# Patient Record
Sex: Female | Born: 1940 | Race: Black or African American | Hispanic: No | Marital: Single | Smoking: Never smoker
Health system: Southern US, Community
[De-identification: ages and names within clinical notes are randomized; demographics above are authoritative.]

## PROBLEM LIST (undated history)

## (undated) DIAGNOSIS — I10 Essential (primary) hypertension: Secondary | ICD-10-CM

## (undated) DIAGNOSIS — M1990 Unspecified osteoarthritis, unspecified site: Secondary | ICD-10-CM

## (undated) DIAGNOSIS — C189 Malignant neoplasm of colon, unspecified: Secondary | ICD-10-CM

## (undated) DIAGNOSIS — Z5189 Encounter for other specified aftercare: Secondary | ICD-10-CM

## (undated) HISTORY — DX: Encounter for other specified aftercare: Z51.89

## (undated) HISTORY — DX: Unspecified osteoarthritis, unspecified site: M19.90

## (undated) HISTORY — PX: COLONOSCOPY: SHX174

## (undated) HISTORY — DX: Malignant neoplasm of colon, unspecified: C18.9

## (undated) HISTORY — PX: BREAST REDUCTION SURGERY: SHX8

## (undated) HISTORY — DX: Essential (primary) hypertension: I10

## (undated) HISTORY — PX: TOTAL ABDOMINAL HYSTERECTOMY: SHX209

---

## 1984-11-19 HISTORY — PX: EVACUATION BREAST HEMATOMA: SHX1537

## 1987-11-20 HISTORY — PX: REDUCTION MAMMAPLASTY: SUR839

## 1997-11-19 DIAGNOSIS — C189 Malignant neoplasm of colon, unspecified: Secondary | ICD-10-CM

## 1997-11-19 HISTORY — PX: COLON SURGERY: SHX602

## 1997-11-19 HISTORY — DX: Malignant neoplasm of colon, unspecified: C18.9

## 1998-10-11 ENCOUNTER — Inpatient Hospital Stay (HOSPITAL_COMMUNITY): Admission: AD | Admit: 1998-10-11 | Discharge: 1998-10-19 | Payer: Self-pay | Admitting: Gastroenterology

## 1998-10-12 ENCOUNTER — Encounter: Payer: Self-pay | Admitting: Gastroenterology

## 1999-10-30 ENCOUNTER — Other Ambulatory Visit: Admission: RE | Admit: 1999-10-30 | Discharge: 1999-10-30 | Payer: Self-pay | Admitting: Gastroenterology

## 1999-10-30 ENCOUNTER — Encounter (INDEPENDENT_AMBULATORY_CARE_PROVIDER_SITE_OTHER): Payer: Self-pay

## 1999-11-10 ENCOUNTER — Encounter: Admission: RE | Admit: 1999-11-10 | Discharge: 1999-11-10 | Payer: Self-pay | Admitting: General Surgery

## 1999-11-10 ENCOUNTER — Encounter (HOSPITAL_BASED_OUTPATIENT_CLINIC_OR_DEPARTMENT_OTHER): Payer: Self-pay | Admitting: General Surgery

## 2000-11-15 ENCOUNTER — Encounter: Admission: RE | Admit: 2000-11-15 | Discharge: 2000-11-15 | Payer: Self-pay | Admitting: General Surgery

## 2000-11-15 ENCOUNTER — Encounter (HOSPITAL_BASED_OUTPATIENT_CLINIC_OR_DEPARTMENT_OTHER): Payer: Self-pay | Admitting: General Surgery

## 2001-11-17 ENCOUNTER — Encounter: Admission: RE | Admit: 2001-11-17 | Discharge: 2001-11-17 | Payer: Self-pay | Admitting: Internal Medicine

## 2001-11-17 ENCOUNTER — Encounter: Payer: Self-pay | Admitting: Internal Medicine

## 2001-11-21 ENCOUNTER — Encounter: Admission: RE | Admit: 2001-11-21 | Discharge: 2001-11-21 | Payer: Self-pay | Admitting: Internal Medicine

## 2001-11-21 ENCOUNTER — Encounter: Payer: Self-pay | Admitting: Internal Medicine

## 2002-11-26 ENCOUNTER — Encounter: Payer: Self-pay | Admitting: Internal Medicine

## 2002-11-26 ENCOUNTER — Encounter: Admission: RE | Admit: 2002-11-26 | Discharge: 2002-11-26 | Payer: Self-pay | Admitting: Internal Medicine

## 2003-12-30 ENCOUNTER — Encounter: Admission: RE | Admit: 2003-12-30 | Discharge: 2003-12-30 | Payer: Self-pay | Admitting: Internal Medicine

## 2005-01-04 ENCOUNTER — Encounter: Admission: RE | Admit: 2005-01-04 | Discharge: 2005-01-04 | Payer: Self-pay | Admitting: Internal Medicine

## 2006-01-08 ENCOUNTER — Encounter: Admission: RE | Admit: 2006-01-08 | Discharge: 2006-01-08 | Payer: Self-pay | Admitting: Internal Medicine

## 2007-01-14 ENCOUNTER — Encounter: Admission: RE | Admit: 2007-01-14 | Discharge: 2007-01-14 | Payer: Self-pay | Admitting: Internal Medicine

## 2007-10-21 ENCOUNTER — Ambulatory Visit: Payer: Self-pay | Admitting: Gastroenterology

## 2007-11-04 ENCOUNTER — Ambulatory Visit: Payer: Self-pay | Admitting: Gastroenterology

## 2008-02-13 ENCOUNTER — Encounter: Admission: RE | Admit: 2008-02-13 | Discharge: 2008-02-13 | Payer: Self-pay | Admitting: Internal Medicine

## 2009-02-14 ENCOUNTER — Encounter: Admission: RE | Admit: 2009-02-14 | Discharge: 2009-02-14 | Payer: Self-pay | Admitting: Internal Medicine

## 2010-02-15 ENCOUNTER — Encounter: Admission: RE | Admit: 2010-02-15 | Discharge: 2010-02-15 | Payer: Self-pay | Admitting: Internal Medicine

## 2011-01-26 ENCOUNTER — Other Ambulatory Visit: Payer: Self-pay | Admitting: Internal Medicine

## 2011-01-26 DIAGNOSIS — Z1231 Encounter for screening mammogram for malignant neoplasm of breast: Secondary | ICD-10-CM

## 2011-02-19 ENCOUNTER — Ambulatory Visit
Admission: RE | Admit: 2011-02-19 | Discharge: 2011-02-19 | Disposition: A | Discharge status: Home / Self Care | Payer: Medicare Other | Source: Ambulatory Visit | Attending: Internal Medicine | Admitting: Internal Medicine

## 2011-02-19 DIAGNOSIS — Z1231 Encounter for screening mammogram for malignant neoplasm of breast: Secondary | ICD-10-CM

## 2012-01-29 ENCOUNTER — Other Ambulatory Visit: Payer: Self-pay | Admitting: Internal Medicine

## 2012-01-29 DIAGNOSIS — Z1231 Encounter for screening mammogram for malignant neoplasm of breast: Secondary | ICD-10-CM

## 2012-02-26 ENCOUNTER — Ambulatory Visit
Admission: RE | Admit: 2012-02-26 | Discharge: 2012-02-26 | Disposition: A | Discharge status: Home / Self Care | Payer: BC Managed Care – PPO | Source: Ambulatory Visit | Attending: Internal Medicine | Admitting: Internal Medicine

## 2012-02-26 DIAGNOSIS — Z1231 Encounter for screening mammogram for malignant neoplasm of breast: Secondary | ICD-10-CM

## 2012-10-10 ENCOUNTER — Encounter: Payer: Self-pay | Admitting: Gastroenterology

## 2013-02-05 ENCOUNTER — Other Ambulatory Visit: Payer: Self-pay

## 2013-02-27 ENCOUNTER — Ambulatory Visit
Admission: RE | Admit: 2013-02-27 | Discharge: 2013-02-27 | Disposition: A | Discharge status: Home / Self Care | Payer: Medicare Other | Source: Ambulatory Visit

## 2013-02-27 DIAGNOSIS — Z1231 Encounter for screening mammogram for malignant neoplasm of breast: Secondary | ICD-10-CM

## 2014-02-11 ENCOUNTER — Other Ambulatory Visit: Payer: Self-pay

## 2014-02-11 DIAGNOSIS — Z1231 Encounter for screening mammogram for malignant neoplasm of breast: Secondary | ICD-10-CM

## 2014-02-11 DIAGNOSIS — Z9889 Other specified postprocedural states: Secondary | ICD-10-CM

## 2014-03-01 ENCOUNTER — Ambulatory Visit
Admission: RE | Admit: 2014-03-01 | Discharge: 2014-03-01 | Disposition: A | Discharge status: Home / Self Care | Payer: Medicare Other | Source: Ambulatory Visit

## 2014-03-01 DIAGNOSIS — Z1231 Encounter for screening mammogram for malignant neoplasm of breast: Secondary | ICD-10-CM

## 2014-03-01 DIAGNOSIS — Z9889 Other specified postprocedural states: Secondary | ICD-10-CM

## 2014-08-23 ENCOUNTER — Encounter: Payer: Self-pay | Admitting: Gastroenterology

## 2014-08-26 ENCOUNTER — Encounter: Payer: Self-pay | Admitting: Gastroenterology

## 2014-10-20 ENCOUNTER — Ambulatory Visit (AMBULATORY_SURGERY_CENTER): Payer: Self-pay | Admitting: *Deleted

## 2014-10-20 VITALS — Ht 61.0 in | Wt 247.0 lb

## 2014-10-20 DIAGNOSIS — Z85038 Personal history of other malignant neoplasm of large intestine: Secondary | ICD-10-CM

## 2014-10-20 MED ORDER — NA SULFATE-K SULFATE-MG SULF 17.5-3.13-1.6 GM/177ML PO SOLN: ORAL | Status: DC

## 2014-10-20 NOTE — Progress Notes (Signed)
Patient denies any allergies to eggs or soy. Patient denies any problems with anesthesia/sedation. Patient denies any oxygen use at home and does not take any diet/weight loss medications. EMMI education assisgned to patient on colonoscopy, this was explained and instructions given to patient. 

## 2014-11-03 ENCOUNTER — Encounter: Payer: Self-pay | Admitting: Gastroenterology

## 2014-11-03 ENCOUNTER — Ambulatory Visit (AMBULATORY_SURGERY_CENTER): Payer: Medicare Other | Admitting: Gastroenterology

## 2014-11-03 VITALS — BP 131/68 | HR 87 | Temp 97.4°F | Resp 23 | Ht 61.0 in | Wt 247.0 lb

## 2014-11-03 DIAGNOSIS — Z85038 Personal history of other malignant neoplasm of large intestine: Secondary | ICD-10-CM

## 2014-11-03 DIAGNOSIS — D123 Benign neoplasm of transverse colon: Secondary | ICD-10-CM

## 2014-11-03 DIAGNOSIS — K5730 Diverticulosis of large intestine without perforation or abscess without bleeding: Secondary | ICD-10-CM

## 2014-11-03 MED ORDER — SODIUM CHLORIDE 0.9 % IV SOLN: 500.0000 mL | INTRAVENOUS | Status: DC

## 2014-11-03 NOTE — Op Note (Signed)
[facility]  [address] [place], [address]   COLONOSCOPY PROCEDURE REPORT  PATIENT: Adrienne, Sanchez  MR#: [id number] BIRTHDATE: Apr 11, 1941 , 4  yrs. old GENDER: female ENDOSCOPIST: Inda Castle, MD REFERRED ES:PQZR Laurann Montana, M.D. PROCEDURE DATE:  11/03/2014 PROCEDURE:   Colonoscopy with snare polypectomy First Screening Colonoscopy - Avg.  risk and is 50 yrs.  old or older - No.  Prior Negative Screening - Now for repeat screening. N/A  History of Adenoma - Now for follow-up colonoscopy & has been > or = to 3 yrs.  N/A  Polyps Removed Today? Yes. ASA CLASS:   Class II INDICATIONS:high risk personal history of colon cancer. 1999 MEDICATIONS: Monitored anesthesia care and Propofol 180 mg IV  DESCRIPTION OF PROCEDURE:   After the risks benefits and alternatives of the procedure were thoroughly explained, informed consent was obtained.  The digital rectal exam revealed no abnormalities of the rectum.   The LB [id number]  endoscope was introduced through the anus and advanced to the surgical anastomosis. No adverse events experienced.   The quality of the prep was excellent using Suprep  The instrument was then slowly withdrawn as the colon was fully examined.      COLON FINDINGS: A sessile polyp measuring 9 mm in size was found in the proximal transverse colon.  A polypectomy was performed with a cold snare.  The resection was complete, the polyp tissue was completely retrieved and sent to histology.   There was mild diverticulosis noted in the descending colon and sigmoid colon. Retroflexed views revealed no abnormalities. The time to cecum=2 minutes 24 seconds.  Withdrawal time=6 minutes 34 seconds.  The scope was withdrawn and the procedure completed. COMPLICATIONS: There were no immediate complications.  ENDOSCOPIC IMPRESSION: 1.   Sessile polyp was found in the proximal transverse colon; polypectomy was performed with a cold snare 2.    Mild diverticulosis was noted in the descending colon and sigmoid colon  RECOMMENDATIONS: Colonoscopy 5 years  eSigned:  Inda Castle, MD 11/03/2014 12:10 PM   cc:

## 2014-11-03 NOTE — Progress Notes (Signed)
Called to room to assist during endoscopic procedure.  Patient ID and intended procedure confirmed with present staff. Received instructions for my participation in the procedure from the performing physician.  

## 2014-11-03 NOTE — Progress Notes (Signed)
A/ox3 pleased with MAC, report to Sheila RN 

## 2014-11-03 NOTE — Patient Instructions (Signed)
Discharge instructions given. Handouts on polyps and diverticulosis. Resume previous medications. YOU HAD AN ENDOSCOPIC PROCEDURE TODAY AT THE [facility]: Refer to the procedure report that was given to you for any specific questions about what was found during the examination.  If the procedure report does not answer your questions, please call your gastroenterologist to clarify.  If you requested that your care partner not be given the details of your procedure findings, then the procedure report has been included in a sealed envelope for you to review at your convenience later.  YOU SHOULD EXPECT: Some feelings of bloating in the abdomen. Passage of more gas than usual.  Walking can help get rid of the air that was put into your GI tract during the procedure and reduce the bloating. If you had a lower endoscopy (such as a colonoscopy or flexible sigmoidoscopy) you may notice spotting of blood in your stool or on the toilet paper. If you underwent a bowel prep for your procedure, then you may not have a normal bowel movement for a few days.  DIET: Your first meal following the procedure should be a light meal and then it is ok to progress to your normal diet.  A half-sandwich or bowl of soup is an example of a good first meal.  Heavy or fried foods are harder to digest and may make you feel nauseous or bloated.  Likewise meals heavy in dairy and vegetables can cause extra gas to form and this can also increase the bloating.  Drink plenty of fluids but you should avoid alcoholic beverages for 24 hours.  ACTIVITY: Your care partner should take you home directly after the procedure.  You should plan to take it easy, moving slowly for the rest of the day.  You can resume normal activity the day after the procedure however you should NOT DRIVE or use heavy machinery for 24 hours (because of the sedation medicines used during the test).    SYMPTOMS TO REPORT IMMEDIATELY: A gastroenterologist  can be reached at any hour.  During normal business hours, 8:30 AM to 5:00 PM Monday through Friday, call [phone].  After hours and on weekends, please call the GI answering service at [phone] who will take a message and have the physician on call contact you.   Following lower endoscopy (colonoscopy or flexible sigmoidoscopy):  Excessive amounts of blood in the stool  Significant tenderness or worsening of abdominal pains  Swelling of the abdomen that is new, acute  Fever of 100F or higher  FOLLOW UP: If any biopsies were taken you will be contacted by phone or by letter within the next 1-3 weeks.  Call your gastroenterologist if you have not heard about the biopsies in 3 weeks.  Our staff will call the home number listed on your records the next business day following your procedure to check on you and address any questions or concerns that you may have at that time regarding the information given to you following your procedure. This is a courtesy call and so if there is no answer at the home number and we have not heard from you through the emergency physician on call, we will assume that you have returned to your regular daily activities without incident.  SIGNATURES/CONFIDENTIALITY: You and/or your care partner have signed paperwork which will be entered into your electronic medical record.  These signatures attest to the fact that that the information above on your After Visit Summary has been reviewed   and is understood.  Full responsibility of the confidentiality of this discharge information lies with you and/or your care-partner. 

## 2014-11-04 ENCOUNTER — Telehealth: Payer: Self-pay | Admitting: *Deleted

## 2014-11-04 NOTE — Telephone Encounter (Signed)
  Follow up Call-  Call back number 11/03/2014  Post procedure Call Back phone  # [phone]  Permission to leave phone message Yes     Patient questions:  Do you have a fever, pain , or abdominal swelling? No. Pain Score  0 *  Have you tolerated food without any problems? Yes.    Have you been able to return to your normal activities? Yes.    Do you have any questions about your discharge instructions: Diet   No. Medications  No. Follow up visit  No.  Do you have questions or concerns about your Care? No.  Actions: * If pain score is 4 or above: No action needed, pain <4.

## 2014-11-09 ENCOUNTER — Encounter: Payer: Self-pay | Admitting: Gastroenterology

## 2014-12-27 ENCOUNTER — Emergency Department: Payer: Self-pay | Admitting: Student

## 2014-12-27 LAB — CBC WITH DIFFERENTIAL/PLATELET
BASOS PCT: 0.4 %
Basophil #: 0 10*3/uL (ref 0.0–0.1)
EOS PCT: 1.3 %
Eosinophil #: 0.1 10*3/uL (ref 0.0–0.7)
HCT: 43.9 % (ref 35.0–47.0)
HGB: 14.5 g/dL (ref 12.0–16.0)
Lymphocyte #: 2.1 10*3/uL (ref 1.0–3.6)
Lymphocyte %: 21.4 %
MCH: 29.9 pg (ref 26.0–34.0)
MCHC: 33 g/dL (ref 32.0–36.0)
MCV: 91 fL (ref 80–100)
Monocyte #: 0.8 x10 3/mm (ref 0.2–0.9)
Monocyte %: 8.1 %
NEUTROS PCT: 68.8 %
Neutrophil #: 6.7 10*3/uL — ABNORMAL HIGH (ref 1.4–6.5)
Platelet: 421 10*3/uL (ref 150–440)
RBC: 4.84 10*6/uL (ref 3.80–5.20)
RDW: 14.3 % (ref 11.5–14.5)
WBC: 9.7 10*3/uL (ref 3.6–11.0)

## 2014-12-27 LAB — COMPREHENSIVE METABOLIC PANEL
ALK PHOS: 75 U/L (ref 46–116)
Albumin: 3.3 g/dL — ABNORMAL LOW (ref 3.4–5.0)
Anion Gap: 10 (ref 7–16)
BILIRUBIN TOTAL: 0.5 mg/dL (ref 0.2–1.0)
BUN: 13 mg/dL (ref 7–18)
CREATININE: 0.86 mg/dL (ref 0.60–1.30)
Calcium, Total: 8.8 mg/dL (ref 8.5–10.1)
Chloride: 105 mmol/L (ref 98–107)
Co2: 24 mmol/L (ref 21–32)
Glucose: 151 mg/dL — ABNORMAL HIGH (ref 65–99)
OSMOLALITY: 281 (ref 275–301)
Potassium: 3.7 mmol/L (ref 3.5–5.1)
SGOT(AST): 28 U/L (ref 15–37)
SGPT (ALT): 26 U/L (ref 14–63)
Sodium: 139 mmol/L (ref 136–145)
Total Protein: 7.3 g/dL (ref 6.4–8.2)

## 2014-12-27 LAB — URINALYSIS, COMPLETE
Bilirubin,UR: NEGATIVE
Glucose,UR: NEGATIVE mg/dL (ref 0–75)
KETONE: NEGATIVE
Leukocyte Esterase: NEGATIVE
Nitrite: NEGATIVE
PROTEIN: NEGATIVE
Ph: 5 (ref 4.5–8.0)
RBC,UR: 3 /HPF (ref 0–5)
SPECIFIC GRAVITY: 1.021 (ref 1.003–1.030)
Squamous Epithelial: <1
WBC UR: 2 /HPF (ref 0–5)

## 2015-02-16 ENCOUNTER — Other Ambulatory Visit: Payer: Self-pay

## 2015-02-16 DIAGNOSIS — Z1231 Encounter for screening mammogram for malignant neoplasm of breast: Secondary | ICD-10-CM

## 2015-03-08 ENCOUNTER — Ambulatory Visit
Admission: RE | Admit: 2015-03-08 | Discharge: 2015-03-08 | Disposition: A | Discharge status: Home / Self Care | Payer: Self-pay | Source: Ambulatory Visit

## 2015-03-08 DIAGNOSIS — Z1231 Encounter for screening mammogram for malignant neoplasm of breast: Secondary | ICD-10-CM

## 2015-12-16 ENCOUNTER — Ambulatory Visit
Admission: RE | Admit: 2015-12-16 | Discharge: 2015-12-16 | Disposition: A | Discharge status: Home / Self Care | Payer: 59 | Source: Ambulatory Visit | Attending: Internal Medicine | Admitting: Internal Medicine

## 2015-12-16 ENCOUNTER — Other Ambulatory Visit: Payer: Self-pay | Admitting: Internal Medicine

## 2015-12-16 DIAGNOSIS — M25561 Pain in right knee: Secondary | ICD-10-CM

## 2016-03-13 ENCOUNTER — Other Ambulatory Visit: Payer: Self-pay

## 2016-03-13 DIAGNOSIS — Z1231 Encounter for screening mammogram for malignant neoplasm of breast: Secondary | ICD-10-CM

## 2016-03-29 ENCOUNTER — Ambulatory Visit
Admission: RE | Admit: 2016-03-29 | Discharge: 2016-03-29 | Disposition: A | Discharge status: Home / Self Care | Payer: Medicare Other | Source: Ambulatory Visit

## 2016-03-29 DIAGNOSIS — Z1231 Encounter for screening mammogram for malignant neoplasm of breast: Secondary | ICD-10-CM

## 2017-03-08 ENCOUNTER — Other Ambulatory Visit: Payer: Self-pay | Admitting: Internal Medicine

## 2017-03-08 DIAGNOSIS — Z1231 Encounter for screening mammogram for malignant neoplasm of breast: Secondary | ICD-10-CM

## 2017-04-02 ENCOUNTER — Ambulatory Visit
Admission: RE | Admit: 2017-04-02 | Discharge: 2017-04-02 | Disposition: A | Discharge status: Home / Self Care | Payer: Medicare Other | Source: Ambulatory Visit | Attending: Internal Medicine | Admitting: Internal Medicine

## 2017-04-02 DIAGNOSIS — Z1231 Encounter for screening mammogram for malignant neoplasm of breast: Secondary | ICD-10-CM

## 2018-03-20 ENCOUNTER — Other Ambulatory Visit: Payer: Self-pay | Admitting: Internal Medicine

## 2018-03-20 DIAGNOSIS — Z1231 Encounter for screening mammogram for malignant neoplasm of breast: Secondary | ICD-10-CM

## 2018-04-10 ENCOUNTER — Ambulatory Visit
Admission: RE | Admit: 2018-04-10 | Discharge: 2018-04-10 | Disposition: A | Discharge status: Home / Self Care | Payer: Medicare Other | Source: Ambulatory Visit | Attending: Internal Medicine | Admitting: Internal Medicine

## 2018-04-10 DIAGNOSIS — Z1231 Encounter for screening mammogram for malignant neoplasm of breast: Secondary | ICD-10-CM

## 2018-07-30 ENCOUNTER — Other Ambulatory Visit: Payer: Self-pay | Admitting: Internal Medicine

## 2018-07-30 ENCOUNTER — Ambulatory Visit
Admission: RE | Admit: 2018-07-30 | Discharge: 2018-07-30 | Disposition: A | Discharge status: Home / Self Care | Payer: Medicare Other | Source: Ambulatory Visit | Attending: Internal Medicine | Admitting: Internal Medicine

## 2018-07-30 DIAGNOSIS — R0601 Orthopnea: Secondary | ICD-10-CM

## 2018-08-29 ENCOUNTER — Emergency Department (HOSPITAL_COMMUNITY): Payer: Medicare Other

## 2018-08-29 ENCOUNTER — Encounter (HOSPITAL_COMMUNITY): Payer: Self-pay | Admitting: *Deleted

## 2018-08-29 ENCOUNTER — Emergency Department (HOSPITAL_COMMUNITY)
Admission: EM | Admit: 2018-08-29 | Discharge: 2018-08-29 | Disposition: A | Discharge status: Home / Self Care | Payer: Medicare Other | Attending: Emergency Medicine | Admitting: Emergency Medicine

## 2018-08-29 DIAGNOSIS — I10 Essential (primary) hypertension: Secondary | ICD-10-CM | POA: Insufficient documentation

## 2018-08-29 DIAGNOSIS — R0600 Dyspnea, unspecified: Secondary | ICD-10-CM | POA: Diagnosis present

## 2018-08-29 DIAGNOSIS — E876 Hypokalemia: Secondary | ICD-10-CM | POA: Diagnosis not present

## 2018-08-29 DIAGNOSIS — Z79899 Other long term (current) drug therapy: Secondary | ICD-10-CM | POA: Diagnosis not present

## 2018-08-29 LAB — CBC
HEMATOCRIT: 48.3 % — AB (ref 36.0–46.0)
Hemoglobin: 15 g/dL (ref 12.0–15.0)
MCH: 28.7 pg (ref 26.0–34.0)
MCHC: 31.1 g/dL (ref 30.0–36.0)
MCV: 92.4 fL (ref 80.0–100.0)
NRBC: 0 % (ref 0.0–0.2)
Platelets: 482 10*3/uL — ABNORMAL HIGH (ref 150–400)
RBC: 5.23 MIL/uL — ABNORMAL HIGH (ref 3.87–5.11)
RDW: 13.9 % (ref 11.5–15.5)
WBC: 9.4 10*3/uL (ref 4.0–10.5)

## 2018-08-29 LAB — COMPREHENSIVE METABOLIC PANEL
ALT: 23 U/L (ref 0–44)
AST: 34 U/L (ref 15–41)
Albumin: 3.8 g/dL (ref 3.5–5.0)
Alkaline Phosphatase: 60 U/L (ref 38–126)
Anion gap: 14 (ref 5–15)
BUN: 7 mg/dL — AB (ref 8–23)
CHLORIDE: 97 mmol/L — AB (ref 98–111)
CO2: 26 mmol/L (ref 22–32)
CREATININE: 1.06 mg/dL — AB (ref 0.44–1.00)
Calcium: 9.4 mg/dL (ref 8.9–10.3)
GFR calc Af Amer: 56 mL/min — ABNORMAL LOW (ref >60)
GFR calc non Af Amer: 49 mL/min — ABNORMAL LOW (ref >60)
Glucose, Bld: 171 mg/dL — ABNORMAL HIGH (ref 70–99)
Potassium: 3 mmol/L — ABNORMAL LOW (ref 3.5–5.1)
SODIUM: 137 mmol/L (ref 135–145)
Total Bilirubin: 0.9 mg/dL (ref 0.3–1.2)
Total Protein: 7.2 g/dL (ref 6.5–8.1)

## 2018-08-29 LAB — TROPONIN I: Troponin I: <0.03 ng/mL (ref <0.03)

## 2018-08-29 LAB — URINALYSIS, ROUTINE W REFLEX MICROSCOPIC
Bilirubin Urine: NEGATIVE
GLUCOSE, UA: NEGATIVE mg/dL
Hgb urine dipstick: NEGATIVE
Ketones, ur: 20 mg/dL — AB
LEUKOCYTES UA: NEGATIVE
Nitrite: NEGATIVE
PH: 8 (ref 5.0–8.0)
Protein, ur: NEGATIVE mg/dL
SPECIFIC GRAVITY, URINE: 1.005 (ref 1.005–1.030)

## 2018-08-29 LAB — LIPASE, BLOOD: Lipase: 29 U/L (ref 11–51)

## 2018-08-29 MED ORDER — POTASSIUM CHLORIDE CRYS ER 20 MEQ PO TBCR
40.0000 meq | EXTENDED_RELEASE_TABLET | Freq: Once | ORAL | Status: AC
  Administered 2018-08-29: 40 meq via ORAL
  Filled 2018-08-29: qty 2

## 2018-08-29 MED ORDER — FUROSEMIDE 20 MG PO TABS: 20.0000 mg | ORAL_TABLET | Freq: Every day | ORAL | 0 refills | Status: AC

## 2018-08-29 MED ORDER — FUROSEMIDE 10 MG/ML IJ SOLN
40.0000 mg | Freq: Once | INTRAMUSCULAR | Status: AC
  Administered 2018-08-29: 40 mg via INTRAVENOUS
  Filled 2018-08-29: qty 4

## 2018-08-29 MED ORDER — POTASSIUM CHLORIDE CRYS ER 20 MEQ PO TBCR: 20.0000 meq | EXTENDED_RELEASE_TABLET | Freq: Every day | ORAL | 0 refills | Status: DC

## 2018-08-29 NOTE — ED Provider Notes (Signed)
[facility] EMERGENCY DEPARTMENT Provider Note   CSN: [id number] Arrival date & time: 08/29/18  6283     History   Chief Complaint Chief Complaint  Patient presents with  . Emesis    HPI Adrienne Sanchez is a 77 y.o. female.  Patient reports dyspnea especially at night.  She has to sleep sitting up and her sleep has been poor.  No substernal chest pain, significant peripheral edema, fever, sweats, chills, productive cough.  No previous diagnosis of congestive heart failure.  Severity of symptoms is moderate.  Lying supine makes symptoms worse.     Past Medical History:  Diagnosis Date  . Arthritis   . Blood transfusion without reported diagnosis   . Colon cancer ([place]) 1999  . Hypertension     There are no active problems to display for this patient.   Past Surgical History:  Procedure Laterality Date  . BREAST REDUCTION SURGERY    . COLON SURGERY  1999   right hemicolectomy, colon cancer  . COLONOSCOPY    . EVACUATION BREAST HEMATOMA Left 1986  . REDUCTION MAMMAPLASTY Bilateral 1989  . TOTAL ABDOMINAL HYSTERECTOMY       OB History   None      Home Medications    Prior to Admission medications   Medication Sig Start Date End Date Taking? Authorizing Provider  metoprolol tartrate (LOPRESSOR) 25 MG tablet Take 25 mg by mouth 2 (two) times daily. 07/30/18  Yes [provider]  valsartan-hydrochlorothiazide (DIOVAN-HCT) 320-25 MG tablet Take 1 tablet by mouth daily. 06/14/18  Yes [provider]  furosemide (LASIX) 20 MG tablet Take 1 tablet (20 mg total) by mouth daily. 08/29/18   Nat Christen, MD  potassium chloride SA (K-DUR,KLOR-CON) 20 MEQ tablet Take 1 tablet (20 mEq total) by mouth daily. 08/29/18   Nat Christen, MD    Family History Family History  Problem Relation Age of Onset  . Heart disease Mother   . Diabetes Mother   . Breast cancer Mother   . Colon cancer Neg Hx     Social History Social History    Tobacco Use  . Smoking status: Never Smoker  . Smokeless tobacco: Never Used  Substance Use Topics  . Alcohol use: No    Alcohol/week: 0.0 standard drinks  . Drug use: No     Allergies   Patient has no known allergies.   Review of Systems Review of Systems  All other systems reviewed and are negative.    Physical Exam Updated Vital Signs BP (!) 145/74   Pulse 93   Temp 98.3 F (36.8 C) (Oral)   Resp 13   Ht 5\' 2"  (1.575 m)   Wt 112 kg   SpO2 100%   BMI 45.18 kg/m   Physical Exam  Constitutional: She is oriented to person, place, and time. She appears well-developed and well-nourished.  Overweight, no acute distress  HENT:  Head: Normocephalic and atraumatic.  Eyes: Conjunctivae are normal.  Neck: Neck supple.  Cardiovascular: Normal rate and regular rhythm.  Pulmonary/Chest: Effort normal and breath sounds normal.  Abdominal: Soft. Bowel sounds are normal.  Musculoskeletal: Normal range of motion.  Neurological: She is alert and oriented to person, place, and time.  Skin: Skin is warm and dry.  No significant peripheral edema.    Psychiatric: She has a normal mood and affect. Her behavior is normal.  Nursing note and vitals reviewed.    ED Treatments / Results  Labs (all labs ordered  are listed, but only abnormal results are displayed) Labs Reviewed  COMPREHENSIVE METABOLIC PANEL - Abnormal; Notable for the following components:      Result Value   Potassium 3.0 (*)    Chloride 97 (*)    Glucose, Bld 171 (*)    BUN 7 (*)    Creatinine, Ser 1.06 (*)    GFR calc non Af Amer 49 (*)    GFR calc Af Amer 56 (*)    All other components within normal limits  CBC - Abnormal; Notable for the following components:   RBC 5.23 (*)    HCT 48.3 (*)    Platelets 482 (*)    All other components within normal limits  URINALYSIS, ROUTINE W REFLEX MICROSCOPIC - Abnormal; Notable for the following components:   Color, Urine STRAW (*)    Ketones, ur 20 (*)     All other components within normal limits  LIPASE, BLOOD  TROPONIN I    EKG EKG Interpretation  Date/Time:  Friday August 29 2018 13:33:30 EDT Ventricular Rate:  87 PR Interval:    QRS Duration: 145 QT Interval:  428 QTC Calculation: 515 R Axis:   -82 Text Interpretation:  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 EXAM: CHEST - 2 VIEW COMPARISON:  Prior radiograph from 07/30/2018. FINDINGS: Mild accentuation of the cardiac silhouette related AP technique and shallow lung inflation. Heart size stable, and within normal limits. Mediastinal silhouette normal. Aortic atherosclerosis. Lungs hypoinflated with elevation of the right hemidiaphragm. Perihilar vascular congestion without overt pulmonary edema. No appreciable pleural effusion. No focal infiltrates. Mild left perihilar and basilar atelectasis. No pneumothorax. No acute osseous abnormality. IMPRESSION: 1. Shallow lung inflation with mild perihilar vascular congestion without overt pulmonary edema. 2. Superimposed mild left perihilar and basilar atelectasis. 3. Aortic atherosclerosis. Electronically Signed   By: Jeannine Boga M.D.   On: 08/29/2018 13:26    Procedures Procedures (including critical care time)  Medications Ordered in ED Medications  potassium chloride SA (K-DUR,KLOR-CON) CR tablet 40 mEq (40 mEq Oral Given 08/29/18 1446)  furosemide (LASIX) injection 40 mg (40 mg Intravenous Given 08/29/18 1443)     Initial Impression / Assessment and Plan / ED Course  I have reviewed the triage vital signs and the nursing notes.  Pertinent labs & imaging results that were available during my care of the patient were reviewed by me and considered in my medical decision making (see chart for  details).     Patient presents with persistent dyspnea especially at night when lying down.  Chest x-ray shows no frank pulmonary edema, but "vascular congestion".  Will start low-dose Lasix for 10 days.  Replace potassium.  Follow-up with primary care and cardiology.  Discussed with patient and her close friend.  Final Clinical Impressions(s) / ED Diagnoses   Final diagnoses:  Nocturnal dyspnea  Hypokalemia    ED Discharge Orders         Ordered    furosemide (LASIX) 20 MG tablet  Daily     08/29/18 1506    potassium chloride SA (K-DUR,KLOR-CON) 20 MEQ tablet  Daily     08/29/18 1506           Nat Christen, MD 08/29/18 1525

## 2018-08-29 NOTE — ED Notes (Signed)
ED Provider at bedside. 

## 2018-08-29 NOTE — Discharge Instructions (Addendum)
Prescription for fluid medicine or diuretic called Lasix (furosemide).  Also prescription for potassium.  Follow-up with your primary care doctor.  Recommend a cardiology consultation to further evaluate your heart.

## 2018-08-29 NOTE — ED Notes (Signed)
Pt ambulatory to the restroom with EMT

## 2018-08-29 NOTE — ED Notes (Signed)
Patient transported to X-ray 

## 2018-08-29 NOTE — ED Notes (Signed)
Pt given cranberry juice, snacks provided by family members.

## 2018-08-29 NOTE — ED Triage Notes (Signed)
Pt in c/o vomiting that started this morning, states she woke up during the night and symptoms started then, pt states she was seen about a month ago for shortness of breath and that has not fully resolved either, pt wants to have both evaluated again, no distress noted

## 2018-09-15 ENCOUNTER — Other Ambulatory Visit: Payer: Self-pay | Admitting: Internal Medicine

## 2018-09-15 ENCOUNTER — Other Ambulatory Visit (HOSPITAL_COMMUNITY): Payer: Self-pay | Admitting: Internal Medicine

## 2018-09-15 DIAGNOSIS — R0609 Other forms of dyspnea: Secondary | ICD-10-CM

## 2018-09-15 DIAGNOSIS — R634 Abnormal weight loss: Secondary | ICD-10-CM

## 2018-09-15 DIAGNOSIS — R109 Unspecified abdominal pain: Secondary | ICD-10-CM

## 2018-09-16 ENCOUNTER — Ambulatory Visit
Admission: RE | Admit: 2018-09-16 | Discharge: 2018-09-16 | Disposition: A | Discharge status: Home / Self Care | Payer: Medicare Other | Source: Ambulatory Visit | Attending: Internal Medicine | Admitting: Internal Medicine

## 2018-09-16 DIAGNOSIS — R109 Unspecified abdominal pain: Secondary | ICD-10-CM

## 2018-09-16 DIAGNOSIS — R634 Abnormal weight loss: Secondary | ICD-10-CM

## 2018-09-16 MED ORDER — IOPAMIDOL (ISOVUE-300) INJECTION 61%
125.0000 mL | Freq: Once | INTRAVENOUS | Status: AC | PRN
  Administered 2018-09-16: 125 mL via INTRAVENOUS

## 2018-09-18 ENCOUNTER — Other Ambulatory Visit: Payer: Self-pay

## 2018-09-18 ENCOUNTER — Ambulatory Visit (HOSPITAL_COMMUNITY): Payer: Medicare Other | Attending: Cardiology

## 2018-09-18 DIAGNOSIS — R0609 Other forms of dyspnea: Secondary | ICD-10-CM

## 2018-09-18 MED ORDER — PERFLUTREN LIPID MICROSPHERE
1.0000 mL | INTRAVENOUS | Status: AC | PRN
  Administered 2018-09-18: 2 mL via INTRAVENOUS

## 2019-04-07 ENCOUNTER — Other Ambulatory Visit: Payer: Medicare Other

## 2019-04-07 ENCOUNTER — Other Ambulatory Visit: Payer: Self-pay

## 2019-10-21 ENCOUNTER — Encounter: Payer: Self-pay | Admitting: Gastroenterology

## 2019-10-28 ENCOUNTER — Telehealth: Payer: Self-pay | Admitting: Gastroenterology

## 2019-10-28 NOTE — Telephone Encounter (Signed)
Pt aware. Scheduled pt for an OV to come in and discuss if she needs to proceed with recall colon. Pt scheduled to see Dr. Rush Landmark 12/02/19 at 11:30am. Pt aware of appt.

## 2019-10-28 NOTE — Telephone Encounter (Signed)
She has a history of colon cancer (remote), if this is her recall time period, I am OK to proceed with procedure. If she would like to talk/meet me before and finalize a plan, I am OK to do that as well. Please offer her either option. Thank you. GM

## 2019-10-28 NOTE — Telephone Encounter (Signed)
Pt received recall letter for colon. She states that she is 78 years old and is feeling fine. However, she would prefer to have procedure if Dr. Rush Landmark thinks that she should. Please call her.

## 2019-10-28 NOTE — Telephone Encounter (Signed)
Dr. Rush Landmark please see note below and advise.

## 2019-12-02 ENCOUNTER — Encounter: Payer: Self-pay | Admitting: Gastroenterology

## 2019-12-02 ENCOUNTER — Ambulatory Visit: Payer: Medicare PPO | Admitting: Gastroenterology

## 2019-12-02 VITALS — BP 124/88 | HR 120 | Temp 98.2°F | Ht 61.0 in | Wt 231.4 lb

## 2019-12-02 DIAGNOSIS — Z01818 Encounter for other preprocedural examination: Secondary | ICD-10-CM | POA: Diagnosis not present

## 2019-12-02 DIAGNOSIS — Z86010 Personal history of colonic polyps: Secondary | ICD-10-CM

## 2019-12-02 DIAGNOSIS — Z85038 Personal history of other malignant neoplasm of large intestine: Secondary | ICD-10-CM

## 2019-12-02 NOTE — Progress Notes (Signed)
[place] VISIT   Primary Care Provider Lavone Orn, MD [place] [address] [place] [address] [phone]  Referring Provider Lavone Orn, MD [place] [address],  [place] [address] [phone]  Patient Profile: Adrienne Sanchez is a 79 y.o. female with a pmh significant for colon cancer status post resection via right hemicolectomy, colon polyps, hypertension, arthritis.  The patient presents to the [facility] for an evaluation and management of problem(s) noted below:  Problem List 1. History of colon cancer   2. History of colonic polyps   3. Encounter for pre-operative examination     History of Present Illness This is the patient's first visit to the outpatient [facility] in years.  She was previously followed by Dr. Deatra Ina.  Last colonoscopy was performed in 03/08/2014 and at that time she was found to have a tubular adenoma as well as a well-appearing colonic anastomosis and diverticulosis.  Due to her history of colon cancer status post resection years prior she was recommended a 5-year follow-up.  03/09/2019 has passed and now she is 6 years overdue.  Patient is in relatively good health overall and just takes medications for blood pressure which is well controlled per her report.  She is interested in at least 1 more colonoscopy if we feel she is safe for it in an effort of trying to minimize her risk of recurrent cancer.  She has no other significant GI symptoms or complaints at this time.  She has had a prior hysterectomy.  Patient denies any significant heartburn or pyrosis or dysphagia or odynophagia.  He has never had an upper endoscopy.  GI Review of Systems Positive as above Negative for nausea, vomiting, pain, change in bowel habits, melena, hematochezia  Review of Systems General: Denies fevers/chills/weight loss HEENT: Denies oral lesions Cardiovascular: Denies chest  pain/palpitations Pulmonary: Denies shortness of breath/cough Gastroenterological: See HPI Genitourinary: Denies darkened urine or hematuria Hematological: Denies easy bruising/bleeding  Endocrine: Denies temperature intolerance Dermatological: Denies jaundice Psychological: Mood is stable   Medications Current Outpatient Medications  Medication Sig Dispense Refill  . furosemide (LASIX) 20 MG tablet Take 1 tablet (20 mg total) by mouth daily. (Patient taking differently: Take 20 mg by mouth every other day. ) 10 tablet 0  . potassium chloride (KLOR-CON) 10 MEQ tablet Take 10 mEq by mouth daily as needed.    . valsartan-hydrochlorothiazide (DIOVAN-HCT) 80-12.5 MG tablet Take 1 tablet by mouth daily.     No current facility-administered medications for this visit.    Allergies No Known Allergies  Histories Past Medical History:  Diagnosis Date  . Arthritis   . Blood transfusion without reported diagnosis   . Colon cancer ([place]) 1999  . Hypertension    Past Surgical History:  Procedure Laterality Date  . BREAST REDUCTION SURGERY    . COLON SURGERY  1999   right hemicolectomy, colon cancer  . COLONOSCOPY    . EVACUATION BREAST HEMATOMA Left 1986  . REDUCTION MAMMAPLASTY Bilateral 1989  . TOTAL ABDOMINAL HYSTERECTOMY     Social History   Socioeconomic History  . Marital status: Single    Spouse name: Not on file  . Number of children: 0  . Years of education: Not on file  . Highest education level: Not on file  Occupational History  . Occupation: retired  Tobacco Use  . Smoking status: Never Smoker  . Smokeless tobacco: Never Used  Substance and Sexual Activity  . Alcohol  use: No    Alcohol/week: 0.0 standard drinks  . Drug use: No  . Sexual activity: Not on file  Other Topics Concern  . Not on file  Social History Narrative  . Not on file   Social Determinants of Health   Financial Resource Strain:   . Difficulty of Paying Living Expenses: Not on file   Food Insecurity:   . Worried About Charity fundraiser in the Last Year: Not on file  . Ran Out of Food in the Last Year: Not on file  Transportation Needs:   . Lack of Transportation (Medical): Not on file  . Lack of Transportation (Non-Medical): Not on file  Physical Activity:   . Days of Exercise per Week: Not on file  . Minutes of Exercise per Session: Not on file  Stress:   . Feeling of Stress : Not on file  Social Connections:   . Frequency of Communication with Friends and Family: Not on file  . Frequency of Social Gatherings with Friends and Family: Not on file  . Attends Religious Services: Not on file  . Active Member of Clubs or Organizations: Not on file  . Attends Archivist Meetings: Not on file  . Marital Status: Not on file  Intimate Partner Violence:   . Fear of Current or Ex-Partner: Not on file  . Emotionally Abused: Not on file  . Physically Abused: Not on file  . Sexually Abused: Not on file   Family History  Problem Relation Age of Onset  . Heart disease Mother   . Diabetes Mother   . Breast cancer Mother   . Colon cancer Neg Hx   . Esophageal cancer Neg Hx   . Inflammatory bowel disease Neg Hx   . Liver disease Neg Hx   . Pancreatic cancer Neg Hx   . Rectal cancer Neg Hx   . Stomach cancer Neg Hx    I have reviewed her medical, social, and family history in detail and updated the electronic medical record as necessary.    PHYSICAL EXAMINATION  BP 124/88   Pulse (!) 120   Temp 98.2 F (36.8 C)   Ht 5\' 1"  (1.549 m)   Wt 231 lb 6 oz (105 kg)   BMI 43.72 kg/m  Wt Readings from Last 3 Encounters:  12/02/19 231 lb 6 oz (105 kg)  08/29/18 247 lb (112 kg)  11/03/14 247 lb (112 kg)  GEN: NAD, appears stated age, doesn't appear chronically ill PSYCH: Cooperative, without pressured speech EYE: Conjunctivae pink, sclerae anicteric ENT: MMM, without oral ulcers CV: RR without R/Gs  RESP: CTAB posteriorly, without wheezing GI: NABS,  soft, protuberant abdomen, rounded, NT/ND, without rebound or guarding, no HSM appreciated MSK/EXT: Trace bilateral pedal edema SKIN: No jaundice NEURO:  Alert & Oriented x 3, no focal deficits   REVIEW OF DATA  I reviewed the following data at the time of this encounter:  GI Procedures and Studies  2015 colonoscopy ENDOSCOPIC IMPRESSION: 1. Sessile polyp was found in the proximal transverse colon; polypectomy was performed with a cold snare 2. Mild diverticulosis was noted in the descending colon and sigmoid colon  Laboratory Studies  Reviewed those in epic  Imaging Studies  October 2019 CT abdomen pelvis with contrast IMPRESSION: 1. No acute abnormality within the abdomen or pelvis. 2. The stomach is mildly distended and contains more oral contrast material than expected. Query clinical signs and symptoms of delayed gastric emptying? 3. Hepatic steatosis. 4. Old  granulomatous disease in the spleen. 5. Surgical changes consistent with prior right hemicolectomy. No evidence of obstruction. 6. Multilevel degenerative disc disease and facet arthropathy.   ASSESSMENT  Ms. Skutt is a 79 y.o. female with a pmh significant for colon cancer status post resection via right hemicolectomy, colon polyps, hypertension, arthritis.  The patient is seen today for evaluation and management of:  1. History of colon cancer   2. History of colonic polyps   3. Encounter for pre-operative examination    The patient is clinically and hemodynamically stable.  With her prior history of colon cancer, she remains at higher risk for the development of colon polyps as well as colon cancer.  Thankfully, she has had good endoscopic surveillance and monitoring for years.  Based on the patient's current health status clinically she remains a candidate for colonoscopy surveillance.  The risks and benefits of endoscopic evaluation were discussed with the patient; these include but are not limited to the risk  of perforation, infection, bleeding, missed lesions, lack of diagnosis, severe illness requiring hospitalization, as well as anesthesia and sedation related illnesses.  The patient is agreeable to proceed.  We will plan to proceed with a surveillance colonoscopy and dependent on the patient's findings at that time will determine potential role for further endoscopic monitoring/surveillance.  I will continue monitoring/surveillance on this patient as long as we believe that her life expectancy is greater than 10.  I believe she is that.  She does understand that the risks as we age in regards endoscopic evaluation and anesthesia increased slightly but she is willing to accept that.  She hopes that this will be the last colonoscopy she needs to do.  All patient questions were answered, to the best of my ability, and the patient agrees to the aforementioned plan of action with follow-up as indicated.   PLAN  Proceed with scheduling surveillance colonoscopy for history of colon cancer and prior colon polyps Follow-up to be dictated by results of colonoscopy   Orders Placed This Encounter  Procedures  . SARS Coronavirus 2 ([facility] ONLY)  . Ambulatory referral to Gastroenterology    New Prescriptions   No medications on file   Modified Medications   No medications on file    Planned Follow Up No follow-ups on file.   Total Time in Face-to-Face and in Coordination of Care for patient including review/personal interpretation of prior testing, medical history, examination, medication adjustment, documentation with the EHR is >30 minutes.   Justice Britain, MD [facility] Office # [phone]

## 2019-12-02 NOTE — Patient Instructions (Addendum)
You have been scheduled for a colonoscopy. Please follow written instructions given to you at your visit today.  Please pick up your prep supplies at the pharmacy within the next 1-3 days. If you use inhalers (even only as needed), please bring them with you on the day of your procedure.  If you are age 81 or older, your body mass index should be between 23-30. Your Body mass index is 43.72 kg/m. If this is out of the aforementioned range listed, please consider follow up with your Primary Care Provider.    Due to recent changes in healthcare laws, you may see the results of your imaging and laboratory studies on MyChart before your provider has had a chance to review them.  We understand that in some cases there may be results that are confusing or concerning to you. Not all laboratory results come back in the same time frame and the provider may be waiting for multiple results in order to interpret others.  Please give [place] 48 hours in order for your provider to thoroughly review all the results before contacting the office for clarification of your results.   Thank you for choosing me and [facility].  Dr. Rush Landmark

## 2019-12-03 DIAGNOSIS — Z85038 Personal history of other malignant neoplasm of large intestine: Secondary | ICD-10-CM | POA: Insufficient documentation

## 2019-12-03 DIAGNOSIS — Z86010 Personal history of colonic polyps: Secondary | ICD-10-CM | POA: Insufficient documentation

## 2019-12-03 DIAGNOSIS — Z01818 Encounter for other preprocedural examination: Secondary | ICD-10-CM | POA: Insufficient documentation

## 2020-01-01 ENCOUNTER — Other Ambulatory Visit: Payer: Self-pay

## 2020-01-01 ENCOUNTER — Other Ambulatory Visit: Payer: Self-pay | Admitting: Gastroenterology

## 2020-01-01 ENCOUNTER — Ambulatory Visit (INDEPENDENT_AMBULATORY_CARE_PROVIDER_SITE_OTHER): Payer: Medicare PPO

## 2020-01-01 DIAGNOSIS — Z1159 Encounter for screening for other viral diseases: Secondary | ICD-10-CM

## 2020-01-01 DIAGNOSIS — Z86010 Personal history of colonic polyps: Secondary | ICD-10-CM

## 2020-01-01 DIAGNOSIS — Z85038 Personal history of other malignant neoplasm of large intestine: Secondary | ICD-10-CM

## 2020-01-02 LAB — SARS CORONAVIRUS 2 (TAT 6-24 HRS): SARS Coronavirus 2: NEGATIVE

## 2020-01-05 ENCOUNTER — Other Ambulatory Visit: Payer: Self-pay

## 2020-01-05 ENCOUNTER — Encounter: Payer: Self-pay | Admitting: Gastroenterology

## 2020-01-05 ENCOUNTER — Ambulatory Visit (AMBULATORY_SURGERY_CENTER): Payer: Medicare PPO | Admitting: Gastroenterology

## 2020-01-05 VITALS — BP 149/92 | HR 91 | Temp 97.3°F | Resp 15 | Ht 61.0 in | Wt 231.0 lb

## 2020-01-05 DIAGNOSIS — D128 Benign neoplasm of rectum: Secondary | ICD-10-CM | POA: Diagnosis not present

## 2020-01-05 DIAGNOSIS — D123 Benign neoplasm of transverse colon: Secondary | ICD-10-CM

## 2020-01-05 DIAGNOSIS — Z85038 Personal history of other malignant neoplasm of large intestine: Secondary | ICD-10-CM | POA: Diagnosis not present

## 2020-01-05 DIAGNOSIS — D125 Benign neoplasm of sigmoid colon: Secondary | ICD-10-CM

## 2020-01-05 DIAGNOSIS — D127 Benign neoplasm of rectosigmoid junction: Secondary | ICD-10-CM | POA: Diagnosis not present

## 2020-01-05 MED ORDER — SODIUM CHLORIDE 0.9 % IV SOLN: 500.0000 mL | INTRAVENOUS | Status: DC

## 2020-01-05 NOTE — Op Note (Signed)
[place] [place] Patient Name: Adrienne Sanchez Procedure Date: 01/05/2020 1:26 PM MRN: [id number] Endoscopist: Justice Britain , MD Age: 79 Referring MD:  Date of Birth: [birth year] Gender: Female Account #: [id number] Procedure:                Colonoscopy Indications:              High risk colon cancer surveillance: Personal                            history of colon cancer and colon polyps Medicines:                Monitored Anesthesia Care Procedure:                Pre-Anesthesia Assessment:                           - Prior to the procedure, a History and Physical                            was performed, and patient medications and                            allergies were reviewed. The patient's tolerance of                            previous anesthesia was also reviewed. The risks                            and benefits of the procedure and the sedation                            options and risks were discussed with the patient.                            All questions were answered, and informed consent                            was obtained. Prior Anticoagulants: The patient has                            taken no previous anticoagulant or antiplatelet                            agents. ASA Grade Assessment: III - A patient with                            severe systemic disease. After reviewing the risks                            and benefits, the patient was deemed in                            satisfactory condition to undergo the procedure.  After obtaining informed consent, the colonoscope                            was passed under direct vision. Throughout the                            procedure, the patient's blood pressure, pulse, and                            oxygen saturations were monitored continuously. The                            Colonoscope was introduced through the anus and                            advanced to  the the ileocolonic anastomosis. The                            colonoscopy was technically difficult and complex                            due to a tortuous colon. Successful completion of                            the procedure was aided by changing the patient's                            position, using manual pressure, withdrawing and                            reinserting the scope, straightening and shortening                            the scope to obtain bowel loop reduction and using                            scope torsion, finally withdrawing the scope and                            replacing with the pediatric colonoscope. Scope In: 1:35:18 PM Scope Out: 1:52:18 PM Scope Withdrawal Time: 0 hours 14 minutes 58 seconds  Total Procedure Duration: 0 hours 17 minutes 0 seconds  Findings:                 The digital rectal exam findings include                            hemorrhoids. Pertinent negatives include no                            palpable rectal lesions.                           There was evidence of a prior functional end-to-end  ileo-colonic anastomosis in the transverse colon.                            This was patent and was characterized by healthy                            appearing mucosa. The anastomosis was traversed.                           The neo-terminal ileum appeared normal.                           Six sessile polyps were found in the rectum (1),                            sigmoid colon (2) and transverse colon (3). The                            polyps were 2 to 5 mm in size. These polyps were                            removed with a cold snare. Resection and retrieval                            were complete.                           Multiple small-mouthed diverticula were found in                            the recto-sigmoid colon and sigmoid colon.                           Normal mucosa was found in the entire  colon                            otherwise.                           Non-bleeding non-thrombosed internal hemorrhoids                            were found during retroflexion, during perianal                            exam and during digital exam. The hemorrhoids were                            Grade II (internal hemorrhoids that prolapse but                            reduce spontaneously). Complications:            No immediate complications. Estimated Blood Loss:     Estimated blood loss was minimal. Impression:               -  Hemorrhoids found on digital rectal exam.                           - Patent functional end-to-end ileo-colonic                            anastomosis, characterized by healthy appearing                            mucosa.                           - The examined portion of the neo-teriminal ileum                            was normal.                           - Six 2 to 5 mm polyps in the rectum, in the                            sigmoid colon and in the transverse colon, removed                            with a cold snare. Resected and retrieved.                           - Diverticulosis in the recto-sigmoid colon and in                            the sigmoid colon.                           - Normal mucosa in the entire examined colon                            otherwise.                           - Non-bleeding non-thrombosed internal hemorrhoids. Recommendation:           - The patient will be observed post-procedure,                            until all discharge criteria are met.                           - Discharge patient to home.                           - Patient has a contact number available for                            emergencies. The signs and symptoms of potential  delayed complications were discussed with the                            patient. Return to normal activities tomorrow.                             Written discharge instructions were provided to the                            patient.                           - High fiber diet.                           - Use fiber, for example Citrucel, Fibercon, Konsyl                            or Metamucil.                           - Await pathology results.                           - Repeat colonoscopy in 3 - 5 years for                            surveillance based on pathology results would be                            recommended in normal circumstances. However                            patient will be greater than 78 at that time                            period. Would have to discuss in person and with                            PCP whether we believe she would be a continued                            candidate for surveillance, if her health were                            excellent as it is currently. Otherwise, may                            consider this potentially the patient's last                            colonoscopy.                           - The findings and recommendations were discussed  with the patient. Justice Britain, MD 01/05/2020 2:04:02 PM

## 2020-01-05 NOTE — Progress Notes (Signed)
Called to room to assist during endoscopic procedure.  Patient ID and intended procedure confirmed with present staff. Received instructions for my participation in the procedure from the performing physician.  

## 2020-01-05 NOTE — Progress Notes (Signed)
PT taken to PACU. Monitors in place. VSS. Report given to RN. 

## 2020-01-05 NOTE — Patient Instructions (Signed)
Information on polyps, diverticulosis and hemorrhoids given to you today.  Await pathology results.  High fiber diet.  Use FiberCon, Citrucel, Metamucil or Konsyl for supplementation.  YOU HAD AN ENDOSCOPIC PROCEDURE TODAY AT [facility]:   Refer to the procedure report that was given to you for any specific questions about what was found during the examination.  If the procedure report does not answer your questions, please call your gastroenterologist to clarify.  If you requested that your care partner not be given the details of your procedure findings, then the procedure report has been included in a sealed envelope for you to review at your convenience later.  YOU SHOULD EXPECT: Some feelings of bloating in the abdomen. Passage of more gas than usual.  Walking can help get rid of the air that was put into your GI tract during the procedure and reduce the bloating. If you had a lower endoscopy (such as a colonoscopy or flexible sigmoidoscopy) you may notice spotting of blood in your stool or on the toilet paper. If you underwent a bowel prep for your procedure, you may not have a normal bowel movement for a few days.  Please Note:  You might notice some irritation and congestion in your nose or some drainage.  This is from the oxygen used during your procedure.  There is no need for concern and it should clear up in a day or so.  SYMPTOMS TO REPORT IMMEDIATELY:   Following lower endoscopy (colonoscopy or flexible sigmoidoscopy):  Excessive amounts of blood in the stool  Significant tenderness or worsening of abdominal pains  Swelling of the abdomen that is new, acute  Fever of 100F or higher   For urgent or emergent issues, a gastroenterologist can be reached at any hour by calling [phone].   DIET:  We do recommend a small meal at first, but then you may proceed to your regular diet.  Drink plenty of fluids but you should avoid alcoholic beverages for 24  hours.  ACTIVITY:  You should plan to take it easy for the rest of today and you should NOT DRIVE or use heavy machinery until tomorrow (because of the sedation medicines used during the test).    FOLLOW UP: Our staff will call the number listed on your records 48-72 hours following your procedure to check on you and address any questions or concerns that you may have regarding the information given to you following your procedure. If we do not reach you, we will leave a message.  We will attempt to reach you two times.  During this call, we will ask if you have developed any symptoms of COVID 19. If you develop any symptoms (ie: fever, flu-like symptoms, shortness of breath, cough etc.) before then, please call [phone].  If you test positive for Covid 19 in the 2 weeks post procedure, please call and report this information to [place].    If any biopsies were taken you will be contacted by phone or by letter within the next 1-3 weeks.  Please call us at [phone] if you have not heard about the biopsies in 3 weeks.    SIGNATURES/CONFIDENTIALITY: You and/or your care partner have signed paperwork which will be entered into your electronic medical record.  These signatures attest to the fact that that the information above on your After Visit Summary has been reviewed and is understood.  Full responsibility of the confidentiality of this discharge information lies with you and/or your  care-partner.

## 2020-01-05 NOTE — Progress Notes (Signed)
Temp by LC and vitals by [place] 

## 2020-01-08 ENCOUNTER — Telehealth: Payer: Self-pay | Admitting: *Deleted

## 2020-01-08 NOTE — Telephone Encounter (Signed)
  Follow up Call-  Call back number 01/05/2020  Post procedure Call Back phone  # [phone]  Permission to leave phone message Yes  Some recent data might be hidden     Patient questions:  Do you have a fever, pain , or abdominal swelling? No. Pain Score  0 *  Have you tolerated food without any problems? Yes.    Have you been able to return to your normal activities? Yes.    Do you have any questions about your discharge instructions: Diet   No. Medications  No. Follow up visit  No.  Do you have questions or concerns about your Care? No.  Actions: * If pain score is 4 or above: No action needed, pain <4.  1. Have you developed a fever since your procedure? no  2.   Have you had an respiratory symptoms (SOB or cough) since your procedure? no  3.   Have you tested positive for COVID 19 since your procedure no  4.   Have you had any family members/close contacts diagnosed with the COVID 19 since your procedure?  no   If yes to any of these questions please route to Joylene John, RN and Alphonsa Gin, Therapist, sports.

## 2020-01-12 ENCOUNTER — Encounter: Payer: Self-pay | Admitting: Gastroenterology

## 2020-02-29 ENCOUNTER — Other Ambulatory Visit: Payer: Self-pay | Admitting: Internal Medicine

## 2020-02-29 DIAGNOSIS — Z1231 Encounter for screening mammogram for malignant neoplasm of breast: Secondary | ICD-10-CM

## 2020-04-05 ENCOUNTER — Other Ambulatory Visit: Payer: Self-pay

## 2020-04-05 ENCOUNTER — Ambulatory Visit
Admission: RE | Admit: 2020-04-05 | Discharge: 2020-04-05 | Disposition: A | Discharge status: Home / Self Care | Payer: Medicare PPO | Source: Ambulatory Visit | Attending: Internal Medicine | Admitting: Internal Medicine

## 2020-04-05 DIAGNOSIS — Z1231 Encounter for screening mammogram for malignant neoplasm of breast: Secondary | ICD-10-CM

## 2020-05-24 DIAGNOSIS — I10 Essential (primary) hypertension: Secondary | ICD-10-CM | POA: Diagnosis not present

## 2020-07-01 DIAGNOSIS — I10 Essential (primary) hypertension: Secondary | ICD-10-CM | POA: Diagnosis not present

## 2020-07-01 DIAGNOSIS — Z5181 Encounter for therapeutic drug level monitoring: Secondary | ICD-10-CM | POA: Diagnosis not present

## 2020-08-09 DIAGNOSIS — I10 Essential (primary) hypertension: Secondary | ICD-10-CM | POA: Diagnosis not present

## 2020-08-09 DIAGNOSIS — Z23 Encounter for immunization: Secondary | ICD-10-CM | POA: Diagnosis not present

## 2020-10-03 DIAGNOSIS — R7301 Impaired fasting glucose: Secondary | ICD-10-CM | POA: Diagnosis not present

## 2020-10-03 DIAGNOSIS — I10 Essential (primary) hypertension: Secondary | ICD-10-CM | POA: Diagnosis not present

## 2020-10-03 DIAGNOSIS — I5189 Other ill-defined heart diseases: Secondary | ICD-10-CM | POA: Diagnosis not present

## 2020-10-03 DIAGNOSIS — Z1389 Encounter for screening for other disorder: Secondary | ICD-10-CM | POA: Diagnosis not present

## 2020-10-03 DIAGNOSIS — M8580 Other specified disorders of bone density and structure, unspecified site: Secondary | ICD-10-CM | POA: Diagnosis not present

## 2020-10-03 DIAGNOSIS — Z6841 Body Mass Index (BMI) 40.0 and over, adult: Secondary | ICD-10-CM | POA: Diagnosis not present

## 2020-10-03 DIAGNOSIS — Z0000 Encounter for general adult medical examination without abnormal findings: Secondary | ICD-10-CM | POA: Diagnosis not present

## 2020-10-28 ENCOUNTER — Other Ambulatory Visit: Payer: Self-pay | Admitting: Internal Medicine

## 2020-10-28 DIAGNOSIS — M8580 Other specified disorders of bone density and structure, unspecified site: Secondary | ICD-10-CM

## 2020-10-28 DIAGNOSIS — Z1231 Encounter for screening mammogram for malignant neoplasm of breast: Secondary | ICD-10-CM

## 2021-04-06 ENCOUNTER — Ambulatory Visit
Admission: RE | Admit: 2021-04-06 | Discharge: 2021-04-06 | Disposition: A | Discharge status: Home / Self Care | Payer: Medicare PPO | Source: Ambulatory Visit | Attending: Internal Medicine | Admitting: Internal Medicine

## 2021-04-06 ENCOUNTER — Other Ambulatory Visit: Payer: Self-pay

## 2021-04-06 DIAGNOSIS — Z1231 Encounter for screening mammogram for malignant neoplasm of breast: Secondary | ICD-10-CM | POA: Diagnosis not present

## 2021-04-06 DIAGNOSIS — M8580 Other specified disorders of bone density and structure, unspecified site: Secondary | ICD-10-CM

## 2021-04-06 DIAGNOSIS — Z780 Asymptomatic menopausal state: Secondary | ICD-10-CM | POA: Diagnosis not present

## 2021-04-06 DIAGNOSIS — I10 Essential (primary) hypertension: Secondary | ICD-10-CM | POA: Diagnosis not present

## 2021-04-06 DIAGNOSIS — M85851 Other specified disorders of bone density and structure, right thigh: Secondary | ICD-10-CM | POA: Diagnosis not present

## 2021-04-06 DIAGNOSIS — M25511 Pain in right shoulder: Secondary | ICD-10-CM | POA: Diagnosis not present

## 2021-10-10 DIAGNOSIS — I5189 Other ill-defined heart diseases: Secondary | ICD-10-CM | POA: Diagnosis not present

## 2021-10-10 DIAGNOSIS — E7800 Pure hypercholesterolemia, unspecified: Secondary | ICD-10-CM | POA: Diagnosis not present

## 2021-10-10 DIAGNOSIS — M8580 Other specified disorders of bone density and structure, unspecified site: Secondary | ICD-10-CM | POA: Diagnosis not present

## 2021-10-10 DIAGNOSIS — Z0000 Encounter for general adult medical examination without abnormal findings: Secondary | ICD-10-CM | POA: Diagnosis not present

## 2021-10-10 DIAGNOSIS — I10 Essential (primary) hypertension: Secondary | ICD-10-CM | POA: Diagnosis not present

## 2021-10-10 DIAGNOSIS — R7301 Impaired fasting glucose: Secondary | ICD-10-CM | POA: Diagnosis not present

## 2021-10-10 DIAGNOSIS — Z6841 Body Mass Index (BMI) 40.0 and over, adult: Secondary | ICD-10-CM | POA: Diagnosis not present

## 2021-10-10 DIAGNOSIS — Z1389 Encounter for screening for other disorder: Secondary | ICD-10-CM | POA: Diagnosis not present

## 2022-04-05 DIAGNOSIS — I10 Essential (primary) hypertension: Secondary | ICD-10-CM | POA: Diagnosis not present

## 2022-04-05 DIAGNOSIS — Z6841 Body Mass Index (BMI) 40.0 and over, adult: Secondary | ICD-10-CM | POA: Diagnosis not present

## 2022-04-19 ENCOUNTER — Other Ambulatory Visit: Payer: Self-pay | Admitting: Internal Medicine

## 2022-04-19 DIAGNOSIS — Z1231 Encounter for screening mammogram for malignant neoplasm of breast: Secondary | ICD-10-CM

## 2022-05-07 ENCOUNTER — Ambulatory Visit
Admission: RE | Admit: 2022-05-07 | Discharge: 2022-05-07 | Disposition: A | Discharge status: Home / Self Care | Payer: Medicare PPO | Source: Ambulatory Visit | Attending: Internal Medicine | Admitting: Internal Medicine

## 2022-05-07 DIAGNOSIS — Z1231 Encounter for screening mammogram for malignant neoplasm of breast: Secondary | ICD-10-CM

## 2022-11-02 DIAGNOSIS — Z6841 Body Mass Index (BMI) 40.0 and over, adult: Secondary | ICD-10-CM | POA: Diagnosis not present

## 2022-11-02 DIAGNOSIS — Z1331 Encounter for screening for depression: Secondary | ICD-10-CM | POA: Diagnosis not present

## 2022-11-02 DIAGNOSIS — Z79899 Other long term (current) drug therapy: Secondary | ICD-10-CM | POA: Diagnosis not present

## 2022-11-02 DIAGNOSIS — Z0000 Encounter for general adult medical examination without abnormal findings: Secondary | ICD-10-CM | POA: Diagnosis not present

## 2022-11-02 DIAGNOSIS — I5189 Other ill-defined heart diseases: Secondary | ICD-10-CM | POA: Diagnosis not present

## 2022-11-02 DIAGNOSIS — I10 Essential (primary) hypertension: Secondary | ICD-10-CM | POA: Diagnosis not present

## 2022-11-02 DIAGNOSIS — R7303 Prediabetes: Secondary | ICD-10-CM | POA: Diagnosis not present

## 2022-11-02 DIAGNOSIS — Z85038 Personal history of other malignant neoplasm of large intestine: Secondary | ICD-10-CM | POA: Diagnosis not present

## 2022-11-02 DIAGNOSIS — E7800 Pure hypercholesterolemia, unspecified: Secondary | ICD-10-CM | POA: Diagnosis not present

## 2022-11-02 DIAGNOSIS — M8580 Other specified disorders of bone density and structure, unspecified site: Secondary | ICD-10-CM | POA: Diagnosis not present

## 2022-11-02 DIAGNOSIS — M8588 Other specified disorders of bone density and structure, other site: Secondary | ICD-10-CM | POA: Diagnosis not present

## 2023-01-09 ENCOUNTER — Encounter: Payer: Self-pay | Admitting: Gastroenterology

## 2023-04-16 ENCOUNTER — Other Ambulatory Visit: Payer: Self-pay | Admitting: Internal Medicine

## 2023-04-16 DIAGNOSIS — Z1231 Encounter for screening mammogram for malignant neoplasm of breast: Secondary | ICD-10-CM

## 2023-05-15 ENCOUNTER — Ambulatory Visit
Admission: RE | Admit: 2023-05-15 | Discharge: 2023-05-15 | Disposition: A | Discharge status: Home / Self Care | Payer: Medicare PPO | Source: Ambulatory Visit | Attending: Internal Medicine | Admitting: Internal Medicine

## 2023-05-15 DIAGNOSIS — M8580 Other specified disorders of bone density and structure, unspecified site: Secondary | ICD-10-CM | POA: Diagnosis not present

## 2023-05-15 DIAGNOSIS — R7303 Prediabetes: Secondary | ICD-10-CM | POA: Diagnosis not present

## 2023-05-15 DIAGNOSIS — Z1231 Encounter for screening mammogram for malignant neoplasm of breast: Secondary | ICD-10-CM

## 2023-05-15 DIAGNOSIS — I5189 Other ill-defined heart diseases: Secondary | ICD-10-CM | POA: Diagnosis not present

## 2023-05-15 DIAGNOSIS — E7800 Pure hypercholesterolemia, unspecified: Secondary | ICD-10-CM | POA: Diagnosis not present

## 2023-05-15 DIAGNOSIS — M170 Bilateral primary osteoarthritis of knee: Secondary | ICD-10-CM | POA: Diagnosis not present

## 2023-05-15 DIAGNOSIS — Z85038 Personal history of other malignant neoplasm of large intestine: Secondary | ICD-10-CM | POA: Diagnosis not present

## 2023-05-15 DIAGNOSIS — Z79899 Other long term (current) drug therapy: Secondary | ICD-10-CM | POA: Diagnosis not present

## 2023-05-15 DIAGNOSIS — I10 Essential (primary) hypertension: Secondary | ICD-10-CM | POA: Diagnosis not present

## 2023-05-15 DIAGNOSIS — M8588 Other specified disorders of bone density and structure, other site: Secondary | ICD-10-CM | POA: Diagnosis not present

## 2023-08-14 ENCOUNTER — Telehealth: Payer: Self-pay | Admitting: Gastroenterology

## 2023-08-14 NOTE — Telephone Encounter (Signed)
The pt has been set up for office visit on 11/22/2023 to discuss if she needs to have any further colonoscopies.  She is not having any issues.

## 2023-08-14 NOTE — Telephone Encounter (Signed)
Inbound call from patient, is requesting to speak to a nurse in regards to recall colonoscopy. Patient states her PCP stated she was due for one but patient is unsure if she is eligible for another one and would like to confirm before scheduling an appointment.

## 2023-11-05 ENCOUNTER — Other Ambulatory Visit: Payer: Self-pay | Admitting: Internal Medicine

## 2023-11-05 ENCOUNTER — Encounter: Payer: Self-pay | Admitting: Internal Medicine

## 2023-11-05 DIAGNOSIS — I5032 Chronic diastolic (congestive) heart failure: Secondary | ICD-10-CM | POA: Diagnosis not present

## 2023-11-05 DIAGNOSIS — I110 Hypertensive heart disease with heart failure: Secondary | ICD-10-CM | POA: Diagnosis not present

## 2023-11-05 DIAGNOSIS — M8589 Other specified disorders of bone density and structure, multiple sites: Secondary | ICD-10-CM | POA: Diagnosis not present

## 2023-11-05 DIAGNOSIS — Z23 Encounter for immunization: Secondary | ICD-10-CM | POA: Diagnosis not present

## 2023-11-05 DIAGNOSIS — Z79899 Other long term (current) drug therapy: Secondary | ICD-10-CM | POA: Diagnosis not present

## 2023-11-05 DIAGNOSIS — Z1331 Encounter for screening for depression: Secondary | ICD-10-CM | POA: Diagnosis not present

## 2023-11-05 DIAGNOSIS — M170 Bilateral primary osteoarthritis of knee: Secondary | ICD-10-CM | POA: Diagnosis not present

## 2023-11-05 DIAGNOSIS — Z6841 Body Mass Index (BMI) 40.0 and over, adult: Secondary | ICD-10-CM | POA: Diagnosis not present

## 2023-11-05 DIAGNOSIS — E7800 Pure hypercholesterolemia, unspecified: Secondary | ICD-10-CM | POA: Diagnosis not present

## 2023-11-05 DIAGNOSIS — R7303 Prediabetes: Secondary | ICD-10-CM | POA: Diagnosis not present

## 2023-11-05 DIAGNOSIS — Z0000 Encounter for general adult medical examination without abnormal findings: Secondary | ICD-10-CM | POA: Diagnosis not present

## 2023-11-08 ENCOUNTER — Other Ambulatory Visit: Payer: Self-pay | Admitting: Internal Medicine

## 2023-11-08 DIAGNOSIS — Z1231 Encounter for screening mammogram for malignant neoplasm of breast: Secondary | ICD-10-CM

## 2023-11-22 ENCOUNTER — Encounter: Payer: Self-pay | Admitting: Gastroenterology

## 2023-11-22 ENCOUNTER — Ambulatory Visit: Payer: Medicare PPO | Admitting: Gastroenterology

## 2023-11-22 VITALS — BP 116/74 | HR 98 | Ht 61.5 in | Wt 220.0 lb

## 2023-11-22 DIAGNOSIS — Z860101 Personal history of adenomatous and serrated colon polyps: Secondary | ICD-10-CM | POA: Diagnosis not present

## 2023-11-22 DIAGNOSIS — Z85038 Personal history of other malignant neoplasm of large intestine: Secondary | ICD-10-CM

## 2023-11-22 DIAGNOSIS — Z09 Encounter for follow-up examination after completed treatment for conditions other than malignant neoplasm: Secondary | ICD-10-CM

## 2023-11-22 NOTE — Patient Instructions (Signed)
 Let us  know if you have any recurrent issues with rectal bleeding or GI issues, otherwise follow-up as needed.   _______________________________________________________  If your blood pressure at your visit was 140/90 or greater, please contact your primary care physician to follow up on this.  _______________________________________________________  If you are age 85 or older, your body mass index should be between 23-30. Your Body mass index is 40.9 kg/m. If this is out of the aforementioned range listed, please consider follow up with your Primary Care Provider.  If you are age 8 or younger, your body mass index should be between 19-25. Your Body mass index is 40.9 kg/m. If this is out of the aformentioned range listed, please consider follow up with your Primary Care Provider.   ________________________________________________________  The [facility] providers would like to encourage you to use MYCHART to communicate with providers for non-urgent requests or questions.  Due to long hold times on the telephone, sending your provider a message by [facility] may be a faster and more efficient way to get a response.  Please allow 48 business hours for a response.  Please remember that this is for non-urgent requests.  _______________________________________________________  Thank you for choosing me and [facility].  Dr. Wilhelmenia

## 2023-11-26 ENCOUNTER — Encounter: Payer: Self-pay | Admitting: Gastroenterology

## 2023-11-26 DIAGNOSIS — Z860101 Personal history of adenomatous and serrated colon polyps: Secondary | ICD-10-CM | POA: Insufficient documentation

## 2023-11-26 NOTE — Progress Notes (Signed)
 GASTROENTEROLOGY OUTPATIENT CLINIC VISIT   Primary Care Provider Adrienne Rush, MD (Inactive) [address] [place] [address] [phone]  Referring Provider No referring provider defined for this encounter.   Patient Profile: Adrienne Sanchez is a 83 y.o. female with a pmh significant for colon cancer status post resection via right hemicolectomy, colon polyps (TA's), hypertension, arthritis.  The patient presents to the [facility] for an evaluation and management of problem(s) noted below:  Problem List 1. Hx of adenomatous colonic polyps   2. History of colon cancer    Discussed the use of AI scribe software for clinical note transcription with the patient, who gave verbal consent to proceed.  History of Present Illness The patient presents for a follow-up today.  I last saw her in 2021 for colon polyp surveillance.  Multiple adenomatous colon polyps were found, and she was recommended a consideration of a 3-year colonoscopy, for which she comes in today to discuss.  [age] is 4.85 years of age as of today.  She has had no recent health issues and have been in good health since their last colonoscopy.   She has not had any evidence of new onset anemia or changes in her bowel habits or rectal bleeding (melena/hematochezia) or abdominal pain.  She has had some weight gain issues as a result of decreased mobility and has apprehension to weight loss medications (they will cause her to regain if she stops).    The patient feels that she would like to discontinue colonoscopy surveillance if possible.   GI Review of Systems Positive as above Negative for nausea, vomiting, abdominal pain, change in bowel habits, melena, hematochezia  Review of Systems General: Denies fevers/chills/weight loss unintentionally Cardiovascular: Denies chest pain Pulmonary: Denies shortness of breath Gastroenterological: See HPI Genitourinary: Denies darkened  urine Hematological: Denies easy bruising/bleeding  Dermatological: Denies jaundice Psychological: Mood is stable   Medications Current Outpatient Medications  Medication Sig Dispense Refill   furosemide  (LASIX ) 20 MG tablet Take 1 tablet (20 mg total) by mouth daily. (Patient taking differently: Take 20 mg by mouth every other day. As needed) 10 tablet 0   potassium chloride  (KLOR-CON ) 10 MEQ tablet Take 10 mEq by mouth daily as needed.     valsartan-hydrochlorothiazide (DIOVAN-HCT) 80-12.5 MG tablet Take 1 tablet by mouth daily.     VITAMIN D PO Take 1 capsule by mouth daily.     No current facility-administered medications for this visit.    Allergies No Known Allergies  Histories Past Medical History:  Diagnosis Date   Arthritis    Blood transfusion without reported diagnosis    Colon cancer (HCC) 1999   Hypertension    Past Surgical History:  Procedure Laterality Date   BREAST REDUCTION SURGERY     COLON SURGERY  1999   right hemicolectomy, colon cancer   COLONOSCOPY     EVACUATION BREAST HEMATOMA Left 1986   REDUCTION MAMMAPLASTY Bilateral 1989   TOTAL ABDOMINAL HYSTERECTOMY     Social History   Socioeconomic History   Marital status: Single    Spouse name: Not on file   Number of children: 0   Years of education: Not on file   Highest education level: Not on file  Occupational History   Occupation: retired  Tobacco Use   Smoking status: Never   Smokeless tobacco: Never  Substance and Sexual Activity   Alcohol use: No    Alcohol/week: 0.0 standard drinks of alcohol   Drug use:  No   Sexual activity: Not on file  Other Topics Concern   Not on file  Social History Narrative   Not on file   Social Drivers of Health   Financial Resource Strain: Not on file  Food Insecurity: Not on file  Transportation Needs: Not on file  Physical Activity: Not on file  Stress: Not on file  Social Connections: Not on file  Intimate Partner Violence: Not on file    Family History  Problem Relation Age of Onset   Heart disease Mother    Diabetes Mother    Breast cancer Mother    Colon cancer Neg Hx    Esophageal cancer Neg Hx    Inflammatory bowel disease Neg Hx    Liver disease Neg Hx    Pancreatic cancer Neg Hx    Rectal cancer Neg Hx    Stomach cancer Neg Hx    I have reviewed her medical, social, and family history in detail and updated the electronic medical record as necessary.    PHYSICAL EXAMINATION  BP 116/74   Pulse 98   Ht 5' 1.5 (1.562 m)   Wt 220 lb (99.8 kg)   SpO2 98%   BMI 40.90 kg/m  Wt Readings from Last 3 Encounters:  11/22/23 220 lb (99.8 kg)  01/05/20 231 lb (104.8 kg)  12/02/19 231 lb 6 oz (105 kg)  GEN: NAD, appears younger than stated age, doesn't appear chronically ill PSYCH: Cooperative, without pressured speech EYE: Conjunctivae pink, sclerae anicteric ENT: MMM CV: Nontachycardic RESP: No audible wheezing GI: NABS, soft, protuberant abdomen, rounded, NT/ND, without rebound or guarding MSK/EXT: No significant lower extremity edema SKIN: No jaundice NEURO:  Alert & Oriented x 3, no focal deficits   REVIEW OF DATA  I reviewed the following data at the time of this encounter:  GI Procedures and Studies  2021 colonoscopy - Hemorrhoids found on digital rectal exam. - Patent functional end-to-end ileo-colonic anastomosis, characterized by healthy appearing mucosa. - The examined portion of the neo-terminal ileum was normal. - Six 2 to 5 mm polyps in the rectum, in the sigmoid colon and in the transverse colon, removed with a cold snare. Resected and retrieved. - Diverticulosis in the recto-sigmoid colon and in the sigmoid colon. - Normal mucosa in the entire examined colon otherwise. - Non-bleeding non-thrombosed internal hemorrhoids.  Pathology Diagnosis Surgical [P], transverse, rectum and sigmoid, polyp (6) - TUBULAR ADENOMA(S) WITHOUT HIGH-GRADE DYSPLASIA OR MALIGNANCY - OTHER FRAGMENTS OF POLYPOID  COLONIC MUCOSA WITH NO SPECIFIC HISTOPATHOLOGIC CHANGES  Laboratory Studies  Reviewed those in epic  Imaging Studies  No new imaging studies to review   ASSESSMENT  Adrienne Sanchez is a 83 y.o. female with a pmh significant for colon cancer status post resection via right hemicolectomy, colon polyps (TA's), hypertension, arthritis.  The patient is seen today for evaluation and management of:  1. Hx of adenomatous colonic polyps   2. History of colon cancer    The patient is hemodynamically and clinically stable.  Overall, this patient is still relatively healthy with all of her other medical comorbidities.  Her personal history of colon cancer does increase her risk somewhat in regards to potential redevelopment of colon cancer.  She is due for colon polyp surveillance at this time.  With this being said, we had an extensive discussion today and based on her current age and preferences, as the patient is not experiencing any significant symptoms and had a good high-quality colonoscopy within the last 3-1/2  years, she would like to discontinue high risk colon cancer screening/colon polyp surveillance.  I think that is reasonable based on her being nearly 83 years of age at this time.  She knows that if something were to occur in the future in regards to changes in her bowel habits or bleeding or unintentional weight loss or anemia development, that repeat endoscopic evaluation would be required.  We can miss colon cancer development if individuals go longer than 10 years, but hopefully that will not be the case.  We will discontinue colonoscopies at this time.  I will be available in the future if needed.  All patient questions were answered to the best of my ability, and the patient agrees to the aforementioned plan of action with follow-up as indicated.   PLAN  As per extensive discussion as outlined above we will discontinue colonoscopy and colon cancer screening even in this high risk  individual If patient develops anemia or changes in bowel habits or bleeding, please reach out to me soon as possible and repeat endoscopic evaluation will be considered   No orders of the defined types were placed in this encounter.   New Prescriptions   No medications on file   Modified Medications   No medications on file    Planned Follow Up No follow-ups on file.   Total Time in Face-to-Face and in Coordination of Care for patient including review/personal interpretation of prior testing, medical history, examination, medication adjustment, documentation with the EHR is 25 minutes.   Aloha Finner, MD [facility] Advanced Endoscopy Office # [phone]

## 2024-05-04 DIAGNOSIS — I10 Essential (primary) hypertension: Secondary | ICD-10-CM | POA: Diagnosis not present

## 2024-05-04 DIAGNOSIS — Z85038 Personal history of other malignant neoplasm of large intestine: Secondary | ICD-10-CM | POA: Diagnosis not present

## 2024-05-04 DIAGNOSIS — R7303 Prediabetes: Secondary | ICD-10-CM | POA: Diagnosis not present

## 2024-05-04 DIAGNOSIS — I5189 Other ill-defined heart diseases: Secondary | ICD-10-CM | POA: Diagnosis not present

## 2024-05-04 DIAGNOSIS — Z6841 Body Mass Index (BMI) 40.0 and over, adult: Secondary | ICD-10-CM | POA: Diagnosis not present

## 2024-05-04 DIAGNOSIS — M170 Bilateral primary osteoarthritis of knee: Secondary | ICD-10-CM | POA: Diagnosis not present

## 2024-05-04 DIAGNOSIS — Z79899 Other long term (current) drug therapy: Secondary | ICD-10-CM | POA: Diagnosis not present

## 2024-05-15 ENCOUNTER — Ambulatory Visit
Admission: RE | Admit: 2024-05-15 | Discharge: 2024-05-15 | Disposition: A | Discharge status: Home / Self Care | Payer: Medicare PPO | Source: Ambulatory Visit | Attending: Internal Medicine | Admitting: Internal Medicine

## 2024-05-15 DIAGNOSIS — Z1231 Encounter for screening mammogram for malignant neoplasm of breast: Secondary | ICD-10-CM

## 2024-05-23 IMAGING — MG MM DIGITAL SCREENING BILAT W/ TOMO AND CAD
8 series · 8 of 24 positions shown · non-contrast
Comparison: Previous exam(s).

ACR Breast Density Category a: The breast tissue is almost entirely
fatty.

CLINICAL DATA: Screening.

EXAM:
DIGITAL SCREENING BILATERAL MAMMOGRAM WITH TOMOSYNTHESIS AND CAD
TECHNIQUE: Bilateral screening digital craniocaudal and mediolateral oblique
mammograms were obtained. Bilateral screening digital breast
tomosynthesis was performed. The images were evaluated with
computer-aided detection.

[L CC synth-2D]
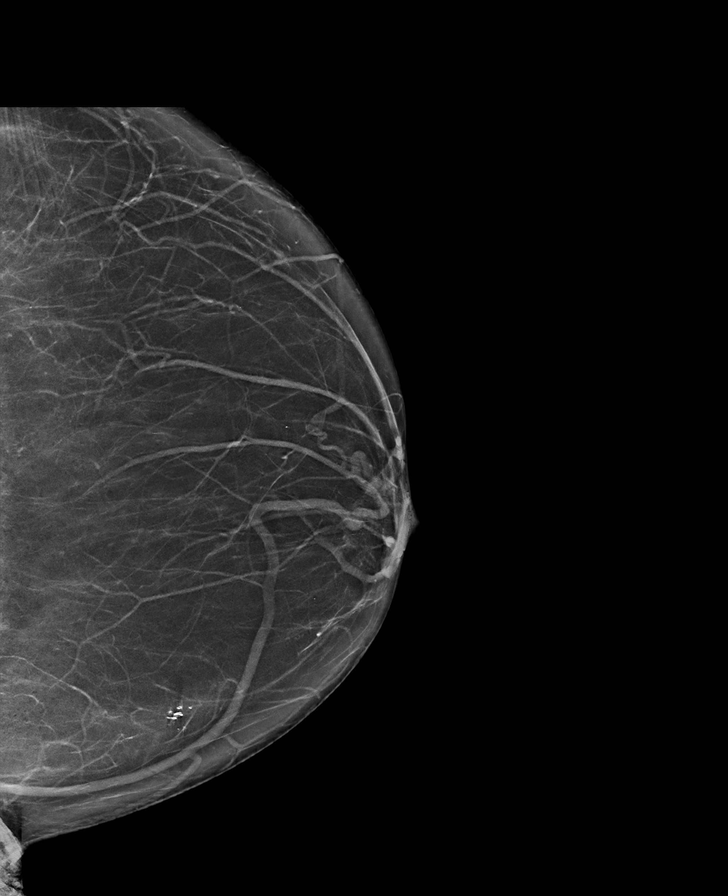

[R MLO synth-2D]
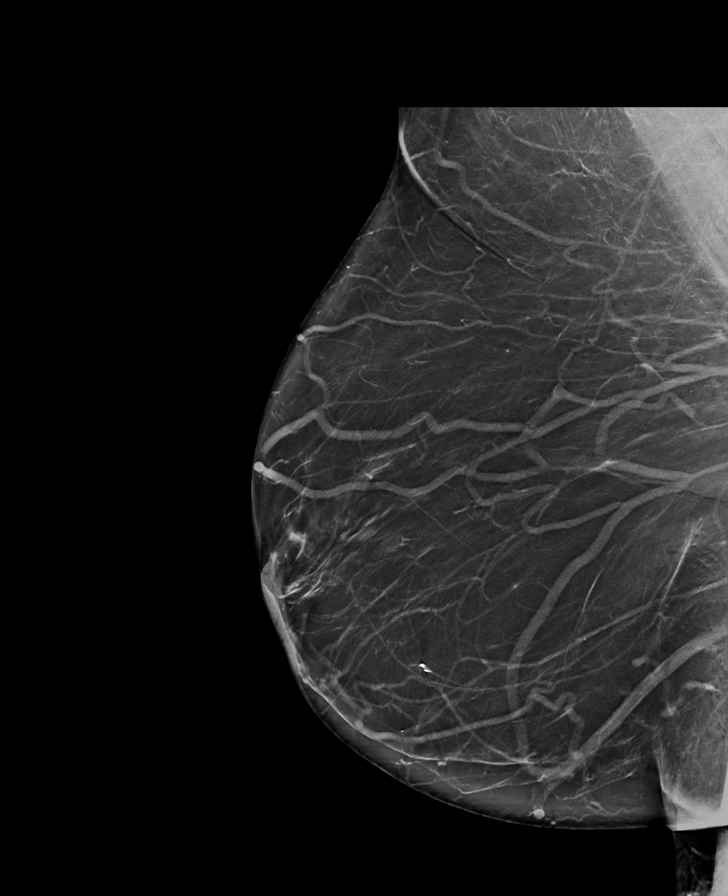

[R CC synth-2D]
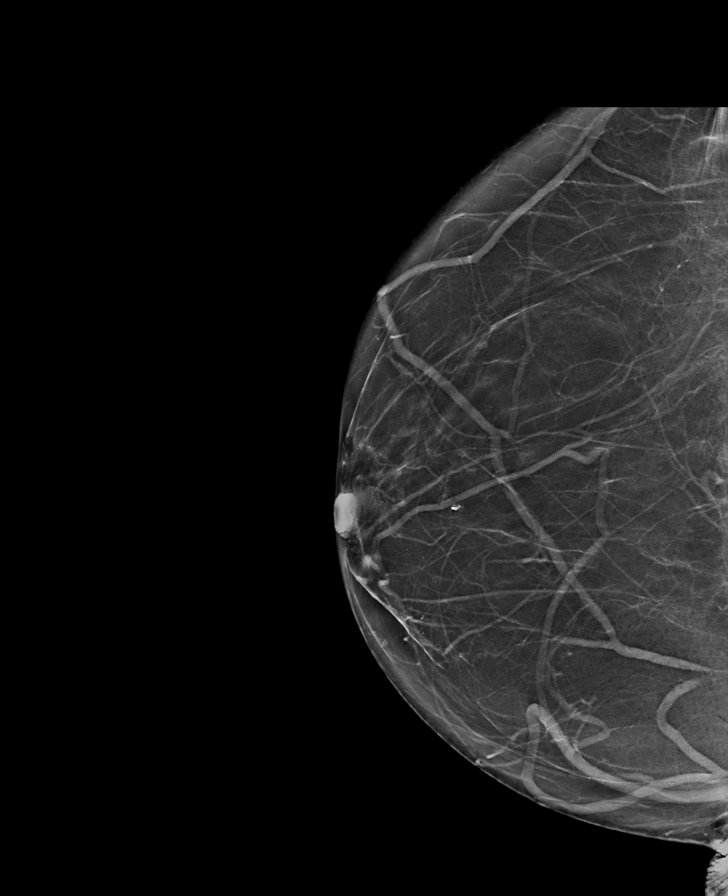

[L MLO synth-2D]
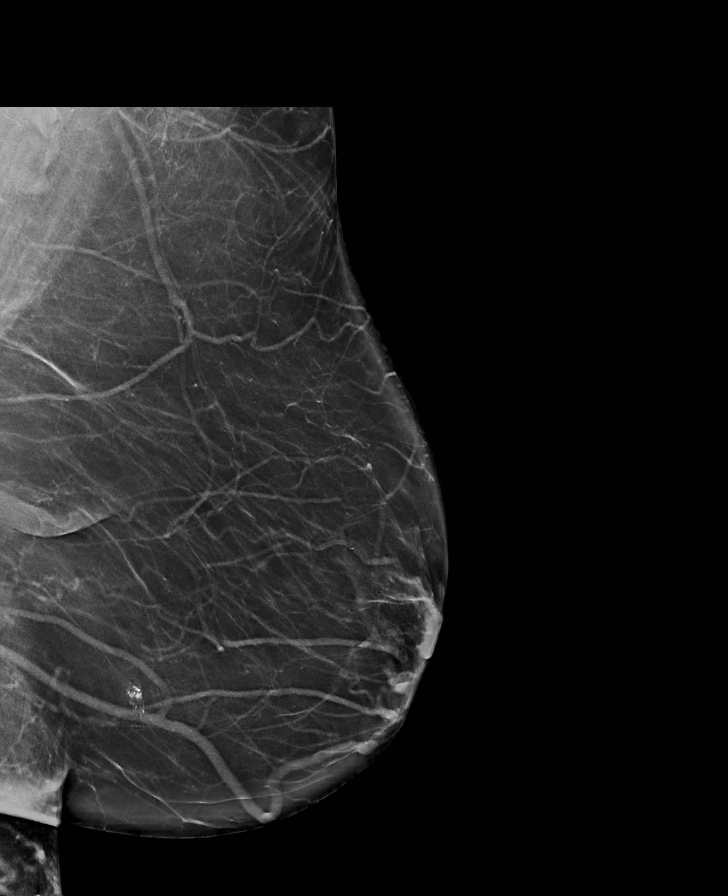

[L CC tomo · tomo slice 34/67.0]
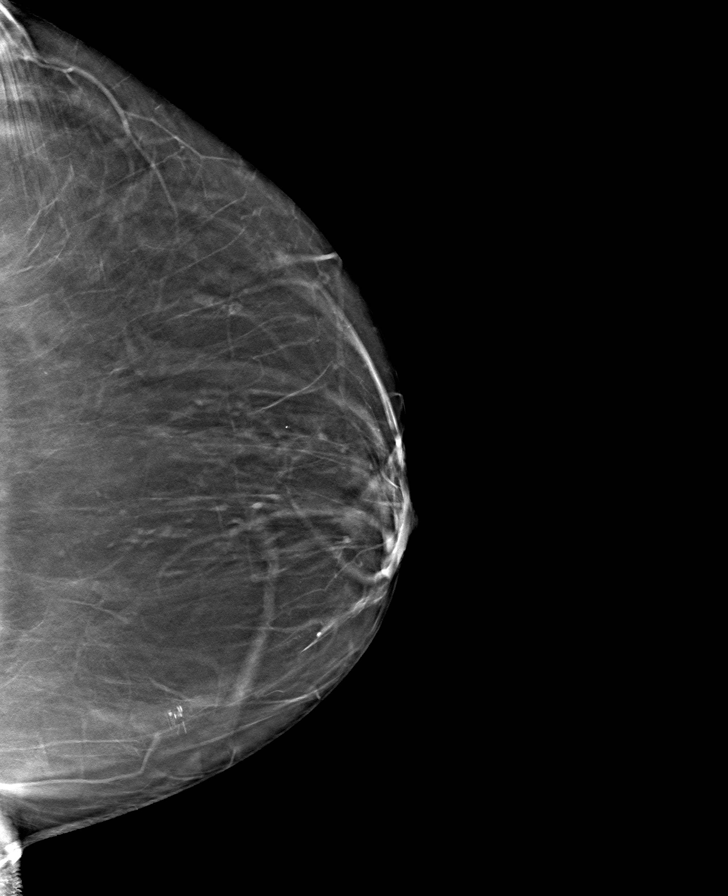

[L MLO tomo · tomo slice 45/89.0]
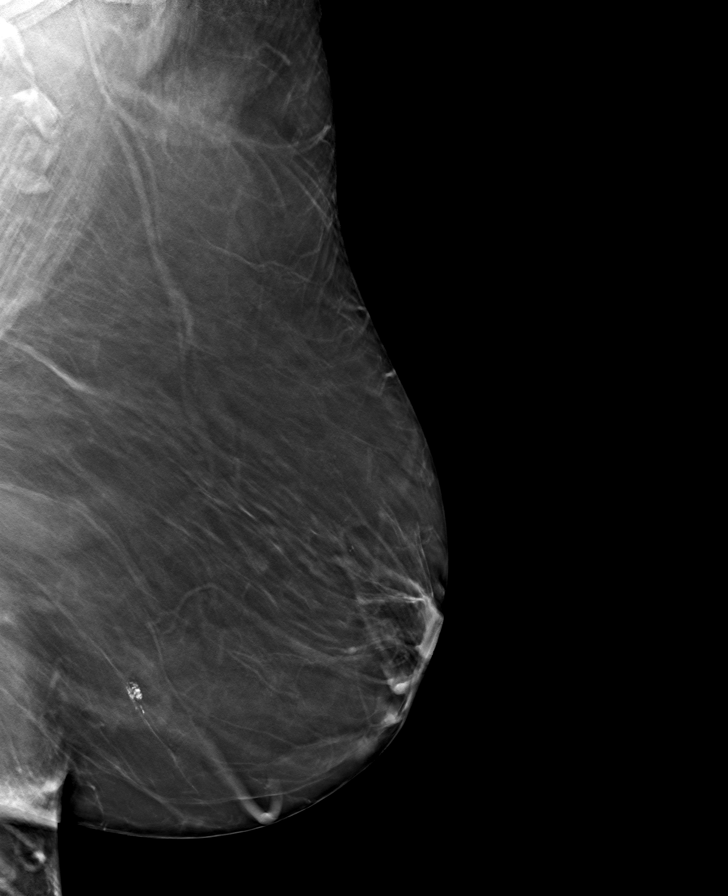

[R CC tomo · tomo slice 35/68.0]
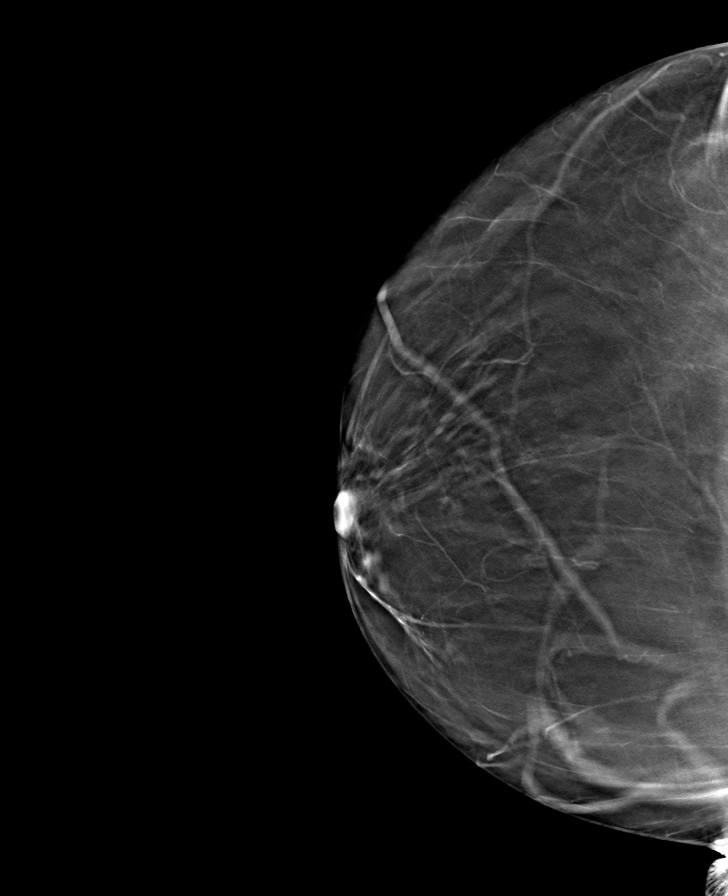

[R MLO tomo · tomo slice 43/86.0]
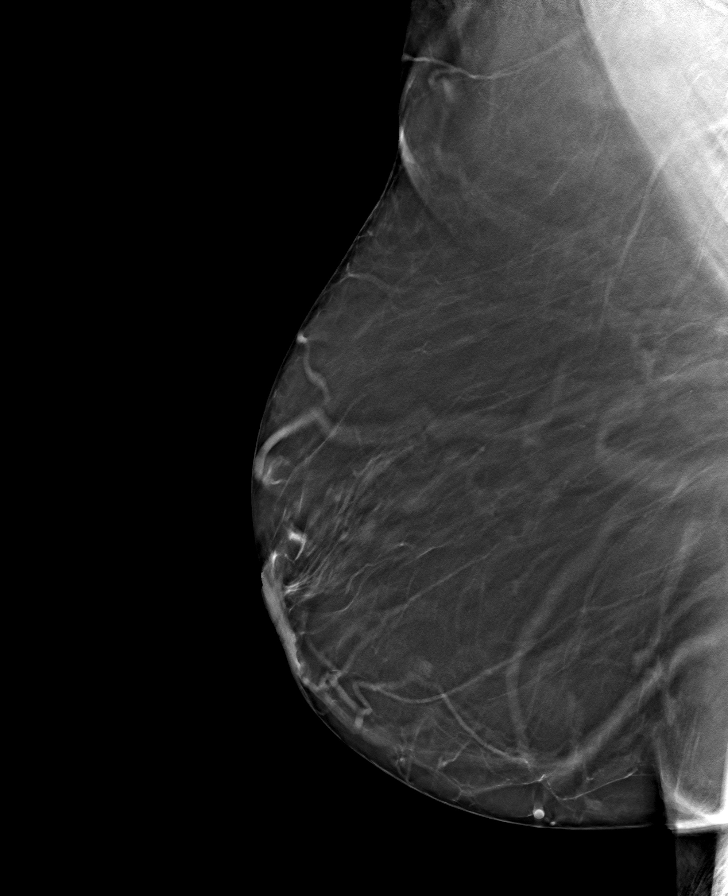

[8 of 24 positions shown; findings below may reference images not displayed]

FINDINGS: There are no findings suspicious for malignancy.
IMPRESSION: No mammographic evidence of malignancy. A result letter of this
screening mammogram will be mailed directly to the patient.

RECOMMENDATION:
Screening mammogram in one year. (Code:0E-3-N98)

BI-RADS CATEGORY  1: Negative.

## 2024-07-06 ENCOUNTER — Other Ambulatory Visit: Payer: Medicare PPO

## 2024-11-09 ENCOUNTER — Other Ambulatory Visit: Payer: Self-pay | Admitting: Internal Medicine

## 2024-11-09 DIAGNOSIS — M8580 Other specified disorders of bone density and structure, unspecified site: Secondary | ICD-10-CM

## 2024-12-01 ENCOUNTER — Ambulatory Visit
Admission: RE | Admit: 2024-12-01 | Discharge: 2024-12-01 | Disposition: A | Discharge status: Home / Self Care | Source: Ambulatory Visit | Attending: Internal Medicine | Admitting: Internal Medicine

## 2024-12-01 DIAGNOSIS — Z780 Asymptomatic menopausal state: Secondary | ICD-10-CM | POA: Diagnosis not present

## 2024-12-01 DIAGNOSIS — M8580 Other specified disorders of bone density and structure, unspecified site: Secondary | ICD-10-CM | POA: Insufficient documentation

## 2024-12-01 DIAGNOSIS — M8589 Other specified disorders of bone density and structure, multiple sites: Secondary | ICD-10-CM | POA: Diagnosis present
